# Patient Record
Sex: Female | Born: 1965 | Race: White | Hispanic: No | Smoking: Former smoker
Health system: Southern US, Community
[De-identification: ages and names within clinical notes are randomized; demographics above are authoritative.]

## PROBLEM LIST (undated history)

## (undated) DIAGNOSIS — R51 Headache: Secondary | ICD-10-CM

## (undated) DIAGNOSIS — Z658 Other specified problems related to psychosocial circumstances: Secondary | ICD-10-CM

## (undated) DIAGNOSIS — K219 Gastro-esophageal reflux disease without esophagitis: Secondary | ICD-10-CM

## (undated) DIAGNOSIS — B379 Candidiasis, unspecified: Secondary | ICD-10-CM

## (undated) DIAGNOSIS — Z8619 Personal history of other infectious and parasitic diseases: Secondary | ICD-10-CM

## (undated) DIAGNOSIS — G43829 Menstrual migraine, not intractable, without status migrainosus: Secondary | ICD-10-CM

## (undated) HISTORY — PX: TUBAL LIGATION: SHX77

## (undated) HISTORY — DX: Menstrual migraine, not intractable, without status migrainosus: G43.829

## (undated) HISTORY — DX: Gastro-esophageal reflux disease without esophagitis: K21.9

## (undated) HISTORY — PX: DILATION AND CURETTAGE OF UTERUS: SHX78

## (undated) HISTORY — DX: Personal history of other infectious and parasitic diseases: Z86.19

## (undated) HISTORY — DX: Headache: R51

## (undated) HISTORY — DX: Other specified problems related to psychosocial circumstances: Z65.8

## (undated) HISTORY — DX: Candidiasis, unspecified: B37.9

---

## 2005-01-31 ENCOUNTER — Ambulatory Visit: Payer: Self-pay | Admitting: Family Medicine

## 2006-12-27 ENCOUNTER — Encounter: Admission: RE | Admit: 2006-12-27 | Discharge: 2006-12-27 | Payer: Self-pay | Admitting: Obstetrics and Gynecology

## 2007-02-14 DIAGNOSIS — G43829 Menstrual migraine, not intractable, without status migrainosus: Secondary | ICD-10-CM

## 2007-02-14 HISTORY — DX: Menstrual migraine, not intractable, without status migrainosus: G43.829

## 2007-12-30 ENCOUNTER — Encounter: Admission: RE | Admit: 2007-12-30 | Discharge: 2007-12-30 | Payer: Self-pay | Admitting: Obstetrics and Gynecology

## 2008-12-30 ENCOUNTER — Encounter: Admission: RE | Admit: 2008-12-30 | Discharge: 2008-12-30 | Payer: Self-pay | Admitting: Obstetrics and Gynecology

## 2010-01-06 ENCOUNTER — Encounter: Admission: RE | Admit: 2010-01-06 | Discharge: 2010-01-06 | Payer: Self-pay | Admitting: Obstetrics and Gynecology

## 2010-10-20 ENCOUNTER — Other Ambulatory Visit: Payer: Self-pay | Admitting: Obstetrics and Gynecology

## 2010-10-20 DIAGNOSIS — Z1231 Encounter for screening mammogram for malignant neoplasm of breast: Secondary | ICD-10-CM

## 2010-11-23 DIAGNOSIS — Z658 Other specified problems related to psychosocial circumstances: Secondary | ICD-10-CM

## 2010-11-23 HISTORY — DX: Other specified problems related to psychosocial circumstances: Z65.8

## 2011-01-12 ENCOUNTER — Ambulatory Visit: Payer: Self-pay

## 2011-02-02 ENCOUNTER — Ambulatory Visit
Admission: RE | Admit: 2011-02-02 | Discharge: 2011-02-02 | Disposition: A | Discharge status: Home / Self Care | Payer: 59 | Source: Ambulatory Visit | Attending: Obstetrics and Gynecology | Admitting: Obstetrics and Gynecology

## 2011-02-02 DIAGNOSIS — Z1231 Encounter for screening mammogram for malignant neoplasm of breast: Secondary | ICD-10-CM

## 2011-10-01 ENCOUNTER — Other Ambulatory Visit: Payer: Self-pay | Admitting: Gastroenterology

## 2011-10-01 DIAGNOSIS — K5900 Constipation, unspecified: Secondary | ICD-10-CM

## 2011-10-03 ENCOUNTER — Other Ambulatory Visit: Payer: 59

## 2011-12-07 ENCOUNTER — Other Ambulatory Visit: Payer: 59

## 2012-02-18 ENCOUNTER — Ambulatory Visit: Payer: Self-pay | Admitting: Obstetrics and Gynecology

## 2012-02-29 ENCOUNTER — Other Ambulatory Visit: Payer: Self-pay | Admitting: Obstetrics and Gynecology

## 2012-02-29 DIAGNOSIS — Z1231 Encounter for screening mammogram for malignant neoplasm of breast: Secondary | ICD-10-CM

## 2012-03-10 ENCOUNTER — Ambulatory Visit: Payer: 59

## 2012-03-31 ENCOUNTER — Ambulatory Visit
Admission: RE | Admit: 2012-03-31 | Discharge: 2012-03-31 | Disposition: A | Discharge status: Home / Self Care | Payer: BC Managed Care – PPO | Source: Ambulatory Visit | Attending: Obstetrics and Gynecology | Admitting: Obstetrics and Gynecology

## 2012-03-31 DIAGNOSIS — Z1231 Encounter for screening mammogram for malignant neoplasm of breast: Secondary | ICD-10-CM

## 2012-04-01 ENCOUNTER — Ambulatory Visit (INDEPENDENT_AMBULATORY_CARE_PROVIDER_SITE_OTHER): Payer: BC Managed Care – HMO | Admitting: Obstetrics and Gynecology

## 2012-04-01 ENCOUNTER — Encounter: Payer: Self-pay | Admitting: Obstetrics and Gynecology

## 2012-04-01 VITALS — BP 102/68 | Ht 65.0 in | Wt 134.0 lb

## 2012-04-01 DIAGNOSIS — Z01419 Encounter for gynecological examination (general) (routine) without abnormal findings: Secondary | ICD-10-CM

## 2012-04-01 DIAGNOSIS — Z124 Encounter for screening for malignant neoplasm of cervix: Secondary | ICD-10-CM

## 2012-04-01 DIAGNOSIS — Z139 Encounter for screening, unspecified: Secondary | ICD-10-CM

## 2012-04-01 NOTE — Progress Notes (Signed)
Contraception BTL Last pap 02/22/2011 Last Mammo 03/31/2012 Last Colonoscopy 2013 Last Dexa Scan n/a Primary MD Dr. Lysbeth Galas Abuse at Home no  No complaints Filed Vitals:   04/01/12 1440  BP: 102/68    ROS: noncontributory  Physical Examination: General appearance - alert, well appearing, and in no distress Neck - supple, no significant adenopathy Chest - clear to auscultation, no wheezes, rales or rhonchi, symmetric air entry Heart - normal rate and regular rhythm Abdomen - soft, nontender, nondistended, no masses or organomegaly Breasts - breasts appear normal, no suspicious masses, no skin or nipple changes or axillary nodes Pelvic - normal external genitalia, vulva, vagina, cervix, uterus and adnexa, light menses Back exam - no CVAT Extremities - no edema, redness or tenderness in the calves or thighs  A/P TSH, Vit D, Lipid panel, CBC and CMET Pap Pt had colonoscopy already with h/o polyps

## 2012-04-02 LAB — COMPREHENSIVE METABOLIC PANEL
ALT: 10 U/L (ref 0–35)
AST: 12 U/L (ref 0–37)
Albumin: 4.7 g/dL (ref 3.5–5.2)
CO2: 25 mEq/L (ref 19–32)
Chloride: 106 mEq/L (ref 96–112)
Creat: 0.62 mg/dL (ref 0.50–1.10)
Potassium: 4 mEq/L (ref 3.5–5.3)
Sodium: 140 mEq/L (ref 135–145)
Total Bilirubin: 0.7 mg/dL (ref 0.3–1.2)

## 2012-04-02 LAB — CBC
HCT: 38.1 % (ref 36.0–46.0)
MCV: 89.2 fL (ref 78.0–100.0)
RBC: 4.27 MIL/uL (ref 3.87–5.11)
WBC: 7.3 10*3/uL (ref 4.0–10.5)

## 2012-04-02 LAB — VITAMIN D 25 HYDROXY (VIT D DEFICIENCY, FRACTURES): Vit D, 25-Hydroxy: 20 ng/mL — ABNORMAL LOW (ref 30–89)

## 2012-04-02 LAB — LIPID PANEL
Cholesterol: 141 mg/dL (ref 0–200)
HDL: 57 mg/dL (ref >39)
LDL Cholesterol: 75 mg/dL (ref 0–99)
Triglycerides: 46 mg/dL (ref <150)
VLDL: 9 mg/dL (ref 0–40)

## 2012-04-02 LAB — PAP IG W/ RFLX HPV ASCU

## 2012-04-08 ENCOUNTER — Telehealth: Payer: Self-pay

## 2012-04-08 ENCOUNTER — Other Ambulatory Visit: Payer: Self-pay

## 2012-04-08 DIAGNOSIS — E559 Vitamin D deficiency, unspecified: Secondary | ICD-10-CM

## 2012-04-08 NOTE — Telephone Encounter (Signed)
Called Vit D softgels 50,000 units 1 x weekly x 12 weeks # 20  0 RF's to [facility] per AR. Melody Comas A

## 2012-04-16 ENCOUNTER — Telehealth: Payer: Self-pay

## 2012-04-16 NOTE — Telephone Encounter (Signed)
Left message for pt to return call. Pt need to pick up RX for Vit-d and follow protocol. Mathis Bud

## 2012-04-17 ENCOUNTER — Telehealth: Payer: Self-pay

## 2012-04-17 NOTE — Telephone Encounter (Signed)
Pt returned call regarding Vit-d protocol. Pt stated that she has already picked-up RX and will follow protocol. Mathis Bud

## 2013-05-19 ENCOUNTER — Other Ambulatory Visit: Payer: Self-pay

## 2013-05-19 DIAGNOSIS — Z1231 Encounter for screening mammogram for malignant neoplasm of breast: Secondary | ICD-10-CM

## 2013-06-08 ENCOUNTER — Ambulatory Visit
Admission: RE | Admit: 2013-06-08 | Discharge: 2013-06-08 | Disposition: A | Discharge status: Home / Self Care | Payer: BC Managed Care – PPO | Source: Ambulatory Visit

## 2013-06-08 DIAGNOSIS — Z1231 Encounter for screening mammogram for malignant neoplasm of breast: Secondary | ICD-10-CM

## 2014-06-07 ENCOUNTER — Encounter: Payer: Self-pay | Admitting: Obstetrics and Gynecology

## 2014-07-19 ENCOUNTER — Other Ambulatory Visit: Payer: Self-pay

## 2014-07-19 DIAGNOSIS — Z1231 Encounter for screening mammogram for malignant neoplasm of breast: Secondary | ICD-10-CM

## 2014-08-12 ENCOUNTER — Ambulatory Visit
Admission: RE | Admit: 2014-08-12 | Discharge: 2014-08-12 | Disposition: A | Discharge status: Home / Self Care | Payer: BC Managed Care – PPO | Source: Ambulatory Visit

## 2014-08-12 DIAGNOSIS — Z1231 Encounter for screening mammogram for malignant neoplasm of breast: Secondary | ICD-10-CM

## 2015-07-26 ENCOUNTER — Other Ambulatory Visit: Payer: Self-pay

## 2015-07-26 DIAGNOSIS — Z1231 Encounter for screening mammogram for malignant neoplasm of breast: Secondary | ICD-10-CM

## 2015-08-15 ENCOUNTER — Ambulatory Visit: Payer: Self-pay

## 2015-10-28 ENCOUNTER — Ambulatory Visit (INDEPENDENT_AMBULATORY_CARE_PROVIDER_SITE_OTHER): Payer: BLUE CROSS/BLUE SHIELD | Admitting: Pediatrics

## 2015-10-28 ENCOUNTER — Encounter: Payer: Self-pay | Admitting: Pediatrics

## 2015-10-28 ENCOUNTER — Encounter (INDEPENDENT_AMBULATORY_CARE_PROVIDER_SITE_OTHER): Payer: Self-pay

## 2015-10-28 VITALS — BP 109/69 | HR 67 | Temp 97.7°F | Ht 65.0 in | Wt 148.2 lb

## 2015-10-28 DIAGNOSIS — Z0000 Encounter for general adult medical examination without abnormal findings: Secondary | ICD-10-CM

## 2015-10-28 DIAGNOSIS — F439 Reaction to severe stress, unspecified: Secondary | ICD-10-CM

## 2015-10-28 DIAGNOSIS — Z6824 Body mass index (BMI) 24.0-24.9, adult: Secondary | ICD-10-CM

## 2015-10-28 MED ORDER — CLONAZEPAM 0.5 MG PO TABS: 0.2500 mg | ORAL_TABLET | Freq: Every day | ORAL | Status: DC | PRN

## 2015-10-28 NOTE — Progress Notes (Signed)
Subjective:    Patient ID: Stacy Wright, female    DOB: [birth year], 50 y.o.   MRN: [id number]  CC: New Patient (Initial Visit) and annual exam  HPI: Stacy Wright is a 50 y.o. female presenting for New Patient (Initial Visit)  Husband disabled Pt now primary caregiver Had to quit her job to care for him Does most of his ADLs  Blood pressure good, checks regularly at home Exercise 3-4 times a week  [facility] Pap smear: normal Mammogram: done Monday  Anxiety: ongoing problem with change to caregiver Klonopin helps, takes 2-3 times a week for the last few months Not currently on something daily    Depression screen [facility] 2/9 10/28/2015  Decreased Interest 2  Down, Depressed, Hopeless 1  PHQ - 2 Score 3  Altered sleeping 1  Tired, decreased energy 1  Change in appetite 1  Feeling bad or failure about yourself  1  Trouble concentrating 0  Moving slowly or fidgety/restless 0  Suicidal thoughts 0  PHQ-9 Score 7      ROS: All systems negative other than what is in HPI   Past Medical History  Diagnosis Date  . H/O varicella   . History of measles   . Headache(784.0)     Frequently  . Yeast infection   . Migraine, menstrual 02/14/07  . GERD (gastroesophageal reflux disease)   . Psychosocial stressors 11/23/10   Fam hx: Fam hx: no breast ca, colon Mother--HTN Father--HTN, brain aneurysm Mat Grandfather with leg cancer Pat GF: DM2  Social History   Social History  . Marital Status: Married    Spouse Name: N/A  . Number of Children: N/A  . Years of Education: N/A   Occupational History  . Not on file.   Social History Main Topics  . Smoking status: Former Smoker    Quit date: 10/27/2013  . Smokeless tobacco: Never Used  . Alcohol Use: No  . Drug Use: No  . Sexual Activity: Yes    Birth Control/ Protection: Surgical     Comment: BTL   Other Topics Concern  . Not on file   Social History Narrative     Current Outpatient  Prescriptions  Medication Sig Dispense Refill  . cetirizine (ZYRTEC) 10 MG tablet Take 10 mg by mouth daily.    . D3-50 50000 units capsule   0  . clonazePAM (KLONOPIN) 0.5 MG tablet Take 0.5-1 tablets (0.25-0.5 mg total) by mouth daily as needed for anxiety. 25 tablet 1   No current facility-administered medications for this visit.       Objective:    BP 109/69 mmHg  Pulse 67  Temp(Src) 97.7 F (36.5 C) (Oral)  Ht _0  (1.651 m)  Wt 148 lb 3.2 oz (67.223 kg)  BMI 24.66 kg/m2  Wt Readings from Last 3 Encounters:  10/28/15 148 lb 3.2 oz (67.223 kg)  04/01/12 134 lb (60.782 kg)    Gen: NAD, alert, cooperative with exam, NCAT EYES: EOMI, no scleral injection or icterus ENT:  TMs pearly gray b/l, OP without erythema LYMPH: no cervical LAD CV: NRRR, normal S1/S2, no murmur, distal pulses 2+ b/l Resp: CTABL, no wheezes, normal WOB Abd: +BS, soft, NTND. no guarding or organomegaly Ext: No edema, warm Neuro: Alert and oriented, strength equal b/l UE and LE, coordination grossly normal MSK: normal muscle bulk     Assessment & Plan:    Stacy Wright was seen today for new patient (initial visit).  Diagnoses and all  orders for this visit:  Stress at home If needing more than 2-3 times a week pt to let me know, will need to start on daily medicine to decrease usage. Needs to be seen in 6 months if still on this medicine. -     clonazePAM (KLONOPIN) 0.5 MG tablet; Take 0.5-1 tablets (0.25-0.5 mg total) by mouth daily as needed for anxiety.  Body mass index (BMI) of 24.0-24.9 in adult Continue to work on healthy lifestyle, diet regular exercise  Encounter for preventive health examination -     BMP8+EGFR -     TSH -     CBC  Pap and Mammogram: done earlier this week [facility]   Follow up plan: Return in about 1 year (around 10/27/2016).  Assunta Found, MD [facility] 10/28/2015, 11:29 AM

## 2015-10-29 LAB — BMP8+EGFR
BUN / CREAT RATIO: 18 (ref 9–23)
BUN: 10 mg/dL (ref 6–24)
CALCIUM: 9.5 mg/dL (ref 8.7–10.2)
CO2: 25 mmol/L (ref 18–29)
CREATININE: 0.55 mg/dL — AB (ref 0.57–1.00)
Chloride: 103 mmol/L (ref 96–106)
GFR calc non Af Amer: 111 mL/min/{1.73_m2} (ref >59)
GFR, EST AFRICAN AMERICAN: 128 mL/min/{1.73_m2} (ref >59)
Glucose: 94 mg/dL (ref 65–99)
Potassium: 4 mmol/L (ref 3.5–5.2)
Sodium: 142 mmol/L (ref 134–144)

## 2015-10-29 LAB — CBC
HEMATOCRIT: 37.7 % (ref 34.0–46.6)
HEMOGLOBIN: 12.6 g/dL (ref 11.1–15.9)
MCH: 29.2 pg (ref 26.6–33.0)
MCHC: 33.4 g/dL (ref 31.5–35.7)
MCV: 88 fL (ref 79–97)
Platelets: 160 10*3/uL (ref 150–379)
RBC: 4.31 x10E6/uL (ref 3.77–5.28)
RDW: 13.6 % (ref 12.3–15.4)
WBC: 5.3 10*3/uL (ref 3.4–10.8)

## 2015-10-29 LAB — TSH: TSH: 1.39 u[IU]/mL (ref 0.450–4.500)

## 2015-11-02 ENCOUNTER — Encounter: Payer: Self-pay | Admitting: Pediatrics

## 2015-12-25 ENCOUNTER — Other Ambulatory Visit: Payer: Self-pay | Admitting: Pediatrics

## 2015-12-26 NOTE — Telephone Encounter (Signed)
Please review and advise.

## 2015-12-26 NOTE — Telephone Encounter (Signed)
Authorize 30 days only. Then contact the patient letting them know that they will need an appointment before any further prescriptions can be sent in. 

## 2015-12-26 NOTE — Telephone Encounter (Signed)
[facility] pt. Last filled 12/02/15 for #25, last seen 10/28/15. Call in at [facility]

## 2015-12-27 NOTE — Telephone Encounter (Signed)
RX called into [facility] for Klonopin Okayed per Dr Livia Snellen Left message for pt to schedule appt

## 2016-01-02 ENCOUNTER — Encounter: Payer: Self-pay | Admitting: Pediatrics

## 2016-01-23 ENCOUNTER — Other Ambulatory Visit: Payer: Self-pay | Admitting: Family Medicine

## 2016-01-24 NOTE — Telephone Encounter (Signed)
Last seen 10/28/15 Dr Evette Doffing  If approved route to nurse to call into [facility]

## 2016-01-24 NOTE — Telephone Encounter (Signed)
Refill call to [facility]

## 2016-02-20 ENCOUNTER — Other Ambulatory Visit: Payer: Self-pay | Admitting: Family Medicine

## 2016-02-21 NOTE — Telephone Encounter (Signed)
Last seen 10/28/15  Dr Evette Doffing   If approved route to nurse to call into [facility]

## 2016-04-10 ENCOUNTER — Other Ambulatory Visit: Payer: Self-pay | Admitting: Pediatrics

## 2016-04-11 ENCOUNTER — Other Ambulatory Visit: Payer: Self-pay | Admitting: Pediatrics

## 2016-04-11 NOTE — Telephone Encounter (Signed)
RX for Klonopin called into [facility] Okayed per Dr Livia Snellen Pt notified

## 2016-05-07 ENCOUNTER — Other Ambulatory Visit: Payer: Self-pay | Admitting: Family

## 2016-05-08 NOTE — Telephone Encounter (Signed)
Last seen 10/28/15 - Stacy Wright

## 2016-05-08 NOTE — Telephone Encounter (Signed)
Pt notified she will need to be seen for refill of Klonopin Will call back to schedule

## 2016-05-08 NOTE — Telephone Encounter (Signed)
Because the medication is a controlled substance, I need to see her every six months for refills so pt must be seen for refills as last visit 3/24

## 2016-05-09 ENCOUNTER — Encounter: Payer: Self-pay | Admitting: Pediatrics

## 2016-05-09 ENCOUNTER — Ambulatory Visit (INDEPENDENT_AMBULATORY_CARE_PROVIDER_SITE_OTHER): Payer: BLUE CROSS/BLUE SHIELD | Admitting: Pediatrics

## 2016-05-09 VITALS — BP 111/71 | HR 67 | Temp 98.3°F | Ht 65.0 in | Wt 151.2 lb

## 2016-05-09 DIAGNOSIS — F411 Generalized anxiety disorder: Secondary | ICD-10-CM | POA: Diagnosis not present

## 2016-05-09 DIAGNOSIS — F410 Panic disorder [episodic paroxysmal anxiety] without agoraphobia: Secondary | ICD-10-CM | POA: Diagnosis not present

## 2016-05-09 MED ORDER — CITALOPRAM HYDROBROMIDE 20 MG PO TABS
20.0000 mg | ORAL_TABLET | Freq: Every day | ORAL | 3 refills | Status: DC
Start: 2016-05-09 — End: 2017-01-30

## 2016-05-09 MED ORDER — CLONAZEPAM 0.5 MG PO TABS: ORAL_TABLET | ORAL | 1 refills | Status: DC

## 2016-05-09 NOTE — Patient Instructions (Signed)
Take half tab celexa (citalopram) for 8 days then full tab If you do not notice any improvement in 4 weeks let me know, we will change celexa dose

## 2016-05-09 NOTE — Progress Notes (Signed)
  Subjective:   Patient ID: Stacy Wright, female    DOB: 01-02-1966, 49 y.o.   MRN: [id number] CC: Medication Refill  HPI: Stacy Wright is a 50 y.o. female presenting for Medication Refill  Husband still in bad health Now taking klonopin daily because of extra stress Mostly around husbands health He is disabled, difficult to transfer at home and transport to appointments Lots of stress around taking care of him Panic attacks a couple times a month Working in garden this spring and fall Stays active  Feels safe at home Mood has been up and down   Depression screen [facility] 2/9 05/09/2016 10/28/2015  Decreased Interest 1 2  Down, Depressed, Hopeless 0 1  PHQ - 2 Score 1 3  Altered sleeping - 1  Tired, decreased energy - 1  Change in appetite - 1  Feeling bad or failure about yourself  - 1  Trouble concentrating - 0  Moving slowly or fidgety/restless - 0  Suicidal thoughts - 0  PHQ-9 Score - 7    Relevant past medical, surgical, family and social history reviewed. Allergies and medications reviewed and updated. History  Smoking Status  . Former Smoker  . Quit date: 10/27/2013  Smokeless Tobacco  . Never Used   ROS: Per HPI   Objective:    BP 111/71   Pulse 67   Temp 98.3 F (36.8 C) (Oral)   Ht 5\' 5"  (1.651 m)   Wt 151 lb 3.2 oz (68.6 kg)   BMI 25.16 kg/m   Wt Readings from Last 3 Encounters:  05/09/16 151 lb 3.2 oz (68.6 kg)  10/28/15 148 lb 3.2 oz (67.2 kg)  04/01/12 134 lb (60.8 kg)    Gen: NAD, alert, cooperative with exam, NCAT EYES: EOMI, no conjunctival injection, or no icterus ENT:  TMs pearly gray b/l, OP without erythema LYMPH: no cervical LAD CV: NRRR, normal S1/S2, no murmur, distal pulses 2+ b/l Resp: CTABL, no wheezes, normal WOB Ext: No edema, warm Neuro: Alert and oriented  Assessment & Plan:  Stacy Wright was seen today for medication refill.  Diagnoses and all orders for this visit:  Generalized anxiety disorder Start citalopram, worsening  symptoms, needing clonazepam daily Minimize benzo use as able Gave one Rx with refill, RTC 8 weeks -     clonazePAM (KLONOPIN) 0.5 MG tablet; TAKE ONE-HALF TO ONE TABLET BY MOUTH ONCE DAILY AS NEEDED FOR ANXIETY -     citalopram (CELEXA) 20 MG tablet; Take 1 tablet (20 mg total) by mouth daily.  Panic attacks -     clonazePAM (KLONOPIN) 0.5 MG tablet; TAKE ONE-HALF TO ONE TABLET BY MOUTH ONCE DAILY AS NEEDED FOR ANXIETY -     citalopram (CELEXA) 20 MG tablet; Take 1 tablet (20 mg total) by mouth daily.  Follow up plan: Return in about 8 weeks (around 07/04/2016). Assunta Found, MD [place]

## 2016-05-11 DIAGNOSIS — F411 Generalized anxiety disorder: Secondary | ICD-10-CM | POA: Insufficient documentation

## 2016-06-07 ENCOUNTER — Telehealth: Payer: Self-pay | Admitting: Pediatrics

## 2016-06-07 ENCOUNTER — Encounter: Payer: Self-pay | Admitting: Pediatrics

## 2016-06-07 NOTE — Telephone Encounter (Signed)
Patient informed she will need to be seen to be evaluated.  Patient states she cannot come in today, will call back tomorrow if she is not feeling any better to make appointment.

## 2016-06-07 NOTE — Telephone Encounter (Signed)
lmtcb jkp 11/2 

## 2016-06-08 ENCOUNTER — Encounter: Payer: Self-pay | Admitting: Pediatrics

## 2016-06-08 ENCOUNTER — Ambulatory Visit (INDEPENDENT_AMBULATORY_CARE_PROVIDER_SITE_OTHER): Payer: BLUE CROSS/BLUE SHIELD | Admitting: Pediatrics

## 2016-06-08 VITALS — BP 109/69 | HR 69 | Temp 97.4°F | Ht 65.0 in | Wt 150.2 lb

## 2016-06-08 DIAGNOSIS — M5442 Lumbago with sciatica, left side: Secondary | ICD-10-CM | POA: Diagnosis not present

## 2016-06-08 DIAGNOSIS — F411 Generalized anxiety disorder: Secondary | ICD-10-CM

## 2016-06-08 DIAGNOSIS — G8929 Other chronic pain: Secondary | ICD-10-CM | POA: Diagnosis not present

## 2016-06-08 MED ORDER — TIZANIDINE HCL 2 MG PO CAPS: 2.0000 mg | ORAL_CAPSULE | Freq: Three times a day (TID) | ORAL | 1 refills | Status: DC

## 2016-06-08 NOTE — Patient Instructions (Addendum)

## 2016-06-08 NOTE — Progress Notes (Signed)
  Subjective:   Patient ID: Stacy Wright, female    DOB: [birth year], 50 y.o.   MRN: [id number] CC: Back Pain and Leg Pain  HPI: Stacy Wright is a 50 y.o. female presenting for Back Pain and Leg Pain  Had an injury in low back about 3 yrs ago, seen in ED Was picking up pallet at the time Since then comes and goes and flares  Uses 600mg  of ibuprofen apprx once a day, not every day for low back pain Lifting overweight disabled husband regularly Has been trying to work with him on weight loss, has been hard He ahs fallen a couple of times past 6 mo, followed at [place] had current flare for about two days Feels liek she needs to move constantly  Anxiety and depression: Mood has been ok Goes down with back pain Taking klonopin not every day Has not yet started citalopram Says she doesn't want to feel dependent on medication plannign to start seeing counselor in January, insurance starting to pay for mental health visits  Relevant past medical, surgical, family and social history reviewed. Allergies and medications reviewed and updated. History  Smoking Status  . Former Smoker  . Quit date: 10/27/2013  Smokeless Tobacco  . Never Used   ROS: Per HPI   Objective:    BP 109/69   Pulse 69   Temp 97.4 F (36.3 C) (Oral)   Ht 5\' 5"  (1.651 m)   Wt 150 lb 3.2 oz (68.1 kg)   BMI 24.99 kg/m   Wt Readings from Last 3 Encounters:  06/08/16 150 lb 3.2 oz (68.1 kg)  05/09/16 151 lb 3.2 oz (68.6 kg)  10/28/15 148 lb 3.2 oz (67.2 kg)    Gen: NAD, alert, cooperative with exam, NCAT EYES: EOMI, no conjunctival injection, or no icterus CV: WWP Resp: normal WOB Abd: +BS, soft, NTND. no guarding or organomegaly Ext: No edema, warm Neuro: Alert and oriented, strength equal b/l UE and LE, coordination grossly normal MSK: +SLR L side, neg R, no spine tenderness, TTP paraspinal muscles L side  Assessment & Plan:  Stacy Wright was seen today for back pain and leg pain.  Diagnoses and all  orders for this visit:  Chronic left-sided low back pain with left-sided sciatica Gave gentle back exercises, muscle relaxant PT in future if still ongoing Avoid lifting husband, talk with his PCP about extra help at home -     tizanidine (ZANAFLEX) 2 MG capsule; Take 1 capsule (2 mg total) by mouth 3 (three) times daily.  Generalized anxiety disorder Encouraged cousneling Pt agreeable to trying Using klonopin not daily Doesn't want to start celexa yet  Follow up plan: Return in about 6 months (around 12/06/2016). Assunta Found, MD [place]

## 2016-07-03 ENCOUNTER — Other Ambulatory Visit: Payer: Self-pay | Admitting: Pediatrics

## 2016-08-02 ENCOUNTER — Ambulatory Visit: Payer: BLUE CROSS/BLUE SHIELD

## 2016-08-03 ENCOUNTER — Encounter: Payer: Self-pay | Admitting: Family

## 2016-08-03 ENCOUNTER — Ambulatory Visit (INDEPENDENT_AMBULATORY_CARE_PROVIDER_SITE_OTHER): Payer: BLUE CROSS/BLUE SHIELD | Admitting: Family

## 2016-08-03 VITALS — BP 113/70 | HR 65 | Temp 97.4°F | Ht 65.0 in | Wt 151.0 lb

## 2016-08-03 DIAGNOSIS — J0100 Acute maxillary sinusitis, unspecified: Secondary | ICD-10-CM | POA: Diagnosis not present

## 2016-08-03 MED ORDER — AMOXICILLIN-POT CLAVULANATE 875-125 MG PO TABS: 1.0000 | ORAL_TABLET | Freq: Two times a day (BID) | ORAL | 0 refills | Status: DC

## 2016-08-03 NOTE — Patient Instructions (Signed)

## 2016-08-03 NOTE — Progress Notes (Signed)
   Subjective:    Patient ID: Stacy Wright, female    DOB: 1965-10-20, 49 y.o.   MRN: [id number]  Cough  This is a new problem. The current episode started in the past 7 days. The problem has been gradually worsening. The problem occurs every few minutes. The cough is non-productive. Associated symptoms include ear pain, headaches and a sore throat.  Sinusitis  This is a new problem. The current episode started in the past 7 days. The problem has been gradually worsening since onset. The pain is mild. Associated symptoms include congestion, coughing, diaphoresis, ear pain, headaches, a hoarse voice, sinus pressure, sneezing and a sore throat. Past treatments include oral decongestants and lying down. The treatment provided mild relief.      Review of Systems  Constitutional: Positive for diaphoresis.  HENT: Positive for congestion, ear pain, hoarse voice, sinus pressure, sneezing and sore throat.   Respiratory: Positive for cough.   Neurological: Positive for headaches.  All other systems reviewed and are negative.      Objective:   Physical Exam  Constitutional: She is oriented to person, place, and time. She appears well-developed and well-nourished. No distress.  HENT:  Head: Normocephalic and atraumatic.  Right Ear: External ear normal.  Left Ear: External ear normal.  Nose: Mucosal edema and rhinorrhea present. Right sinus exhibits maxillary sinus tenderness. Left sinus exhibits maxillary sinus tenderness.  Mouth/Throat: Posterior oropharyngeal erythema present.  Eyes: Pupils are equal, round, and reactive to light.  Neck: Normal range of motion. Neck supple. No thyromegaly present.  Cardiovascular: Normal rate, regular rhythm, normal heart sounds and intact distal pulses.   No murmur heard. Pulmonary/Chest: Effort normal and breath sounds normal. No respiratory distress. She has no wheezes.  Abdominal: Soft. Bowel sounds are normal. She exhibits no distension. There is no  tenderness.  Musculoskeletal: Normal range of motion. She exhibits no edema or tenderness.  Neurological: She is alert and oriented to person, place, and time.  Skin: Skin is warm and dry.  Psychiatric: She has a normal mood and affect. Her behavior is normal. Judgment and thought content normal.  Vitals reviewed.    BP 113/70   Pulse 65   Temp 97.4 F (36.3 C) (Oral)   Ht 5\' 5"  (1.651 m)   Wt 151 lb (68.5 kg)   BMI 25.13 kg/m       Assessment & Plan:  1. Acute maxillary sinusitis, recurrence not specified - Take meds as prescribed - Use a cool mist humidifier  -Use saline nose sprays frequently -Saline irrigations of the nose can be very helpful if done frequently.  * 4X daily for 1 week*  * Use of a nettie pot can be helpful with this. Follow directions with this* -Force fluids -For any cough or congestion  Use plain Mucinex- regular strength or max strength is fine   * Children- consult with Pharmacist for dosing -For fever or aces or pains- take tylenol or ibuprofen appropriate for age and weight.  * for fevers greater than 101 orally you may alternate ibuprofen and tylenol every  3 hours. -Throat lozenges if help -New toothbrush in 3 days - amoxicillin-clavulanate (AUGMENTIN) 875-125 MG tablet; Take 1 tablet by mouth 2 (two) times daily.  Dispense: 14 tablet; Refill: 0   Evelina Dun, FNP

## 2016-08-13 ENCOUNTER — Telehealth: Payer: Self-pay | Admitting: Family

## 2016-08-13 DIAGNOSIS — J0100 Acute maxillary sinusitis, unspecified: Secondary | ICD-10-CM

## 2016-08-13 MED ORDER — AMOXICILLIN-POT CLAVULANATE 875-125 MG PO TABS: 1.0000 | ORAL_TABLET | Freq: Two times a day (BID) | ORAL | 0 refills | Status: DC

## 2016-08-13 NOTE — Telephone Encounter (Signed)
Pt aware.

## 2016-08-13 NOTE — Telephone Encounter (Signed)
Sent in another 7 days. Needs to be seen if not improving.

## 2016-08-15 ENCOUNTER — Ambulatory Visit: Payer: BLUE CROSS/BLUE SHIELD | Admitting: Pediatrics

## 2016-09-13 ENCOUNTER — Other Ambulatory Visit: Payer: Self-pay | Admitting: Pediatrics

## 2016-09-13 ENCOUNTER — Other Ambulatory Visit: Payer: Self-pay | Admitting: Family

## 2016-09-13 DIAGNOSIS — F410 Panic disorder [episodic paroxysmal anxiety] without agoraphobia: Secondary | ICD-10-CM

## 2016-09-13 DIAGNOSIS — F411 Generalized anxiety disorder: Secondary | ICD-10-CM

## 2016-11-19 ENCOUNTER — Other Ambulatory Visit: Payer: Self-pay | Admitting: Pediatrics

## 2016-11-19 DIAGNOSIS — F411 Generalized anxiety disorder: Secondary | ICD-10-CM

## 2016-11-19 DIAGNOSIS — F410 Panic disorder [episodic paroxysmal anxiety] without agoraphobia: Secondary | ICD-10-CM

## 2016-11-19 NOTE — Telephone Encounter (Signed)
Last seen by vincent in 06/2016- MMM coverage - please address and route to nurse for phone in.

## 2016-11-19 NOTE — Telephone Encounter (Signed)
Please call in clonazepam with 0 refills 

## 2017-01-25 ENCOUNTER — Other Ambulatory Visit: Payer: Self-pay | Admitting: Nurse Practitioner

## 2017-01-25 DIAGNOSIS — F410 Panic disorder [episodic paroxysmal anxiety] without agoraphobia: Secondary | ICD-10-CM

## 2017-01-25 DIAGNOSIS — F411 Generalized anxiety disorder: Secondary | ICD-10-CM

## 2017-01-28 ENCOUNTER — Other Ambulatory Visit: Payer: Self-pay | Admitting: Nurse Practitioner

## 2017-01-28 DIAGNOSIS — F410 Panic disorder [episodic paroxysmal anxiety] without agoraphobia: Secondary | ICD-10-CM

## 2017-01-28 DIAGNOSIS — F411 Generalized anxiety disorder: Secondary | ICD-10-CM

## 2017-01-28 NOTE — Telephone Encounter (Signed)
Needs an appt, hasnt been seen for 6 months

## 2017-01-28 NOTE — Telephone Encounter (Signed)
Lasr seen 08/03/16  Christy  Dr Evette Doffing PCP  If approved route to nurse to call into [facility]

## 2017-01-28 NOTE — Telephone Encounter (Signed)
Must be seen

## 2017-01-28 NOTE — Telephone Encounter (Signed)
Last seen 08/03/16  Christy  Dr Evette Doffing PCP  If approved route to nurse to call into [facility]

## 2017-01-30 ENCOUNTER — Ambulatory Visit (INDEPENDENT_AMBULATORY_CARE_PROVIDER_SITE_OTHER): Payer: BLUE CROSS/BLUE SHIELD | Admitting: Pediatrics

## 2017-01-30 ENCOUNTER — Encounter: Payer: Self-pay | Admitting: Pediatrics

## 2017-01-30 VITALS — BP 112/67 | HR 67 | Temp 97.9°F | Ht 65.0 in | Wt 146.6 lb

## 2017-01-30 DIAGNOSIS — M7711 Lateral epicondylitis, right elbow: Secondary | ICD-10-CM

## 2017-01-30 DIAGNOSIS — F411 Generalized anxiety disorder: Secondary | ICD-10-CM | POA: Diagnosis not present

## 2017-01-30 DIAGNOSIS — F410 Panic disorder [episodic paroxysmal anxiety] without agoraphobia: Secondary | ICD-10-CM

## 2017-01-30 MED ORDER — CITALOPRAM HYDROBROMIDE 20 MG PO TABS: 20.0000 mg | ORAL_TABLET | Freq: Every day | ORAL | 1 refills | Status: DC

## 2017-01-30 MED ORDER — CLONAZEPAM 0.5 MG PO TABS: ORAL_TABLET | ORAL | 2 refills | Status: DC

## 2017-01-30 NOTE — Patient Instructions (Addendum)
Counterforce brace for lateral epicondilitis (tennis elbow)   Tennis Elbow Rehab Ask your health care provider which exercises are safe for you. Do exercises exactly as told by your health care provider and adjust them as directed. It is normal to feel mild stretching, pulling, tightness, or discomfort as you do these exercises, but you should stop right away if you feel sudden pain or your pain gets worse. Do not begin these exercises until told by your health care provider. Stretching and range of motion exercises These exercises warm up your muscles and joints and improve the movement and flexibility of your elbow. These exercises also help to relieve pain, numbness, and tingling. Exercise A: Wrist extensor stretch 1. Extend your left / right elbow with your fingers pointing down. 2. Gently pull the palm of your left / right hand toward you until you feel a gentle stretch on the top of your forearm. 3. To increase the stretch, push your left / right hand toward the outer edge or pinkie side of your forearm. 4. Hold this position for __________ seconds. Repeat __________ times. Complete this exercise __________ times a day. If directed by your health care provider, repeat this stretch except do it with a bent elbow this time. Exercise B: Wrist flexor stretch  1. Extend your left / right elbow and turn your palm upward. 2. Gently pull your left / right palm and fingertips back so your wrist extends and your fingers point more toward the ground. 3. You should feel a gentle stretch on the inside of your forearm. 4. Hold this position for __________ seconds. Repeat __________ times. Complete this exercise __________ times a day. If directed by your health care provider, repeat this stretch except do it with a bent elbow this time. Strengthening exercises These exercises build strength and endurance in your elbow. Endurance is the ability to use your muscles for a long time, even after they get  tired. Exercise C: Wrist extensors  1. Sit with your left / right forearm palm-down and fully supported on a table or countertop. Your elbow should be resting below the height of your shoulder. 2. Let your left / right wrist extend over the edge of the surface. 3. Loosely hold a __________ weight or a piece of rubber exercise band or tubing in your left / right hand. Slowly curl your left / right hand up toward your forearm. If you are using band or tubing, hold the band or tubing in place with your other hand to provide resistance. 4. Hold this position for __________ seconds. 5. Slowly return to the starting position. Repeat __________ times. Complete this exercise __________ times a day. Exercise D: Radial deviators  1. Stand with a __________ weight in your left / righthand. Or, sit while holding a rubber exercise band or tubing with your other arm supported on a table or countertop. Position your hand so your thumb is on top. 2. Raise your hand upward in front of you so your thumb travels toward your forearm, or pull up on the rubber tubing. 3. Hold this position for __________ seconds. 4. Slowly return to the starting position. Repeat __________ times. Complete this exercise __________ times a day. Exercise E: Eccentric wrist extensors 1. Sit with your left / right forearm palm-down and fully supported on a table or countertop. Your elbow should be resting below the height of your shoulder. 2. If told by your health care provider, hold a __________ weight in your hand. 3. Let your  left / right wrist extend over the edge of the surface. 4. Use your other hand to lift up your left / right hand toward your forearm. Keep your forearm on the table. 5. Using only the muscles in your left / right hand, slowly lower your hand back down to the starting position. Repeat __________ times. Complete this exercise __________ times a day. This information is not intended to replace advice given to you  by your health care provider. Make sure you discuss any questions you have with your health care provider. Document Released: 07/23/2005 Document Revised: 03/28/2016 Document Reviewed: 04/21/2015 Elsevier Interactive Patient Education  [employer].

## 2017-01-30 NOTE — Progress Notes (Signed)
  Subjective:   Patient ID: Stacy Wright, female    DOB: April 14, 1966, 51 y.o.   MRN: [id number] CC: Medication Refill and Elbow Pain (Right)  HPI: Terese Heier is a 51 y.o. female presenting for Medication Refill and Elbow Pain (Right)  R Elbow pain: moving her husband a lot, he is disabled Trying to get him to do more of the weight  Anxiety, depression Mood: much better Taking citalopram every day now Using klonopin a few days a week No recent panic attakcs  Back pain: still caring for chronically il husband, lifting him a lot Has been able to get him to do more for himself recently  Relevant past medical, surgical, family and social history reviewed. Allergies and medications reviewed and updated. History  Smoking Status  . Former Smoker  . Quit date: 10/27/2013  Smokeless Tobacco  . Never Used   ROS: Per HPI   Objective:    BP 112/67   Pulse 67   Temp 97.9 F (36.6 C) (Oral)   Ht 5\' 5"  (1.651 m)   Wt 146 lb 9.6 oz (66.5 kg)   BMI 24.40 kg/m   Wt Readings from Last 3 Encounters:  01/30/17 146 lb 9.6 oz (66.5 kg)  08/03/16 151 lb (68.5 kg)  06/08/16 150 lb 3.2 oz (68.1 kg)    Gen: NAD, alert, cooperative with exam, NCAT EYES: EOMI, no conjunctival injection, or no icterus CV: NRRR, normal S1/S2, no murmur, distal pulses 2+ b/l Resp: CTABL, no wheezes, normal WOB Abd: +BS, soft, NTND. no guarding or organomegaly Ext: No edema, warm Neuro: Alert and oriented MSK: normal R forearm, elbow to sinpection No redness or swelling ttp Lateral R epicondyle Pain with supination R forearm against resistance  Assessment & Plan:  Asna was seen today for medication refill and elbow pain.  Diagnoses and all orders for this visit:  Lateral epicondylitis of right elbow New problem Rest, NSAIDs, ice, counterforce brace Slow return to lifting  Panic attacks Improved, klonopin 2-3 days a week Cont for now -     clonazePAM (KLONOPIN) 0.5 MG tablet; Take 1/2 to 1 Tablet by  mouth once daily as needed -     citalopram (CELEXA) 20 MG tablet; Take 1 tablet (20 mg total) by mouth daily.  Generalized anxiety disorder Improved with citalopram Cont citalopram, klonopin as needed If needing more regularly let m eknow rtc for refills -     clonazePAM (KLONOPIN) 0.5 MG tablet; Take 1/2 to 1 Tablet by mouth once daily as needed -     citalopram (CELEXA) 20 MG tablet; Take 1 tablet (20 mg total) by mouth daily.   Follow up plan: Return in about 6 months (around 08/01/2017). Assunta Found, MD [place]

## 2017-02-28 ENCOUNTER — Ambulatory Visit (INDEPENDENT_AMBULATORY_CARE_PROVIDER_SITE_OTHER): Payer: BLUE CROSS/BLUE SHIELD

## 2017-02-28 ENCOUNTER — Encounter: Payer: Self-pay | Admitting: Family

## 2017-02-28 ENCOUNTER — Ambulatory Visit (INDEPENDENT_AMBULATORY_CARE_PROVIDER_SITE_OTHER): Payer: BLUE CROSS/BLUE SHIELD | Admitting: Family

## 2017-02-28 VITALS — BP 105/70 | HR 62 | Temp 98.2°F | Ht 65.0 in | Wt 146.0 lb

## 2017-02-28 DIAGNOSIS — M7711 Lateral epicondylitis, right elbow: Secondary | ICD-10-CM

## 2017-02-28 DIAGNOSIS — M25521 Pain in right elbow: Secondary | ICD-10-CM

## 2017-02-28 MED ORDER — PREDNISONE 10 MG (21) PO TBPK: ORAL_TABLET | ORAL | 0 refills | Status: DC

## 2017-02-28 MED ORDER — NAPROXEN 500 MG PO TABS: 500.0000 mg | ORAL_TABLET | Freq: Two times a day (BID) | ORAL | 1 refills | Status: DC

## 2017-02-28 NOTE — Patient Instructions (Signed)
Tennis Elbow Tennis elbow (lateral epicondylitis) is inflammation of the outer tendons of your forearm close to your elbow. Your tendons attach your muscles to your bones. The outer tendons of your forearm are used to extend your wrist, and they attach on the outside part of your elbow. Tennis elbow is often found in people who play tennis, but anyone may get the condition from repeatedly extending the wrist or turning the forearm. What are the causes? This condition is caused by repeatedly extending your wrist and using your hands. It can result from sports or work that requires repetitive forearm movements. Tennis elbow may also be caused by an injury. What increases the risk? You have a higher risk of developing tennis elbow if you play tennis or another racquet sport. You also have a higher risk if you frequently use your hands for work. This condition is also more likely to develop in:  Musicians.  Carpenters, painters, and plumbers.  Cooks.  Cashiers.  People who work in factories.  Construction workers.  Butchers.  People who use computers.  What are the signs or symptoms? Symptoms of this condition include:  Pain and tenderness in your forearm and the outer part of your elbow. You may only feel the pain when you use your arm, or you may feel it even when you are not using your arm.  A burning feeling that runs from your elbow through your arm.  Weak grip in your hands.  How is this diagnosed? This condition may be diagnosed by medical history and physical exam. You may also have other tests, including:  X-rays.  MRI.  How is this treated? Your health care provider will recommend lifestyle adjustments, such as resting and icing your arm. Treatment may also include:  Medicines for inflammation. This may include shots of cortisone if your pain continues.  Physical therapy. This may include massage or exercises.  An elbow brace.  Surgery may eventually be  recommended if your pain does not go away with treatment. Follow these instructions at home: Activity  Rest your elbow and wrist as directed by your health care provider. Try to avoid any activities that caused the problem until your health care provider says that you can do them again.  If a physical therapist teaches you exercises, do all of them as directed.  If you lift an object, lift it with your palm facing upward. This lowers the stress on your elbow. Lifestyle  If your tennis elbow is caused by sports, check your equipment and make sure that: ? You are using it correctly. ? It is the best fit for you.  If your tennis elbow is caused by work, take breaks frequently, if you are able. Talk with your manager about how to best perform tasks in a way that is safe. ? If your tennis elbow is caused by computer use, talk with your manager about any changes that can be made to your work environment. General instructions  If directed, apply ice to the painful area: ? Put ice in a plastic bag. ? Place a towel between your skin and the bag. ? Leave the ice on for 20 minutes, 2-3 times per day.  Take medicines only as directed by your health care provider.  If you were given a brace, wear it as directed by your health care provider.  Keep all follow-up visits as directed by your health care provider. This is important. Contact a health care provider if:  Your pain does not   get better with treatment.  Your pain gets worse.  You have numbness or weakness in your forearm, hand, or fingers. This information is not intended to replace advice given to you by your health care provider. Make sure you discuss any questions you have with your health care provider. Document Released: 07/23/2005 Document Revised: 03/22/2016 Document Reviewed: 07/19/2014 Elsevier Interactive Patient Education  2018 Else[employer] Inc[employer]  

## 2017-02-28 NOTE — Progress Notes (Addendum)
   Subjective:    Patient ID: Stacy Wright, female    DOB: 30-Apr-1966, 51 y.o.   MRN: [id number]  HPI Pt presents to the office today with right elbow pain that started a few months ago that is worse. PT states intermittent aching pain of 5-6 out 10. PT has tried rest, Voltaren gel, motrin, and wearing a brace with mild relief.   PT denies any injury, but states she cares for her husband and uses it "a lot". Pt is requesting x-ray today.    Review of Systems  Musculoskeletal: Positive for arthralgias.  All other systems reviewed and are negative.      Objective:   Physical Exam  Constitutional: She is oriented to person, place, and time. She appears well-developed and well-nourished. No distress.  HENT:  Head: Normocephalic and atraumatic.  Right Ear: External ear normal.  Mouth/Throat: Oropharynx is clear and moist.  Eyes: Pupils are equal, round, and reactive to light.  Neck: Normal range of motion. Neck supple. No thyromegaly present.  Cardiovascular: Normal rate, regular rhythm, normal heart sounds and intact distal pulses.   No murmur heard. Pulmonary/Chest: Effort normal and breath sounds normal. No respiratory distress. She has no wheezes.  Abdominal: Soft. Bowel sounds are normal. She exhibits no distension. There is no tenderness.  Musculoskeletal: Normal range of motion. She exhibits tenderness (mild tenderness in  right lateral elbow ). She exhibits no edema.  Neurological: She is alert and oriented to person, place, and time. She has normal reflexes. No cranial nerve deficit.  Skin: Skin is warm and dry.  Psychiatric: She has a normal mood and affect. Her behavior is normal. Judgment and thought content normal.  Vitals reviewed.  X-ray- Negative Preliminary reading by Evelina Dun, FNP [facility]    BP 105/70   Pulse 62   Temp 98.2 F (36.8 C) (Oral)   Ht 5\' 5"  (1.651 m)   Wt 146 lb (66.2 kg)   BMI 24.30 kg/m      Assessment & Plan:  1. Lateral epicondylitis of  right elbow - predniSONE (STERAPRED UNI-PAK 21 TAB) 10 MG (21) TBPK tablet; Use as directed  Dispense: 21 tablet; Refill: 0 - naproxen (NAPROSYN) 500 MG tablet; Take 1 tablet (500 mg total) by mouth 2 (two) times daily with a meal.  Dispense: 60 tablet; Refill: 1  2. Right elbow pain - DG Elbow 2 Views Right; Future - predniSONE (STERAPRED UNI-PAK 21 TAB) 10 MG (21) TBPK tablet; Use as directed  Dispense: 21 tablet; Refill: 0 - naproxen (NAPROSYN) 500 MG tablet; Take 1 tablet (500 mg total) by mouth 2 (two) times daily with a meal.  Dispense: 60 tablet; Refill: 1  Rest Ice  Continue wear brace RTO prn   Evelina Dun, FNP

## 2017-04-01 ENCOUNTER — Other Ambulatory Visit: Payer: Self-pay | Admitting: Pediatrics

## 2017-04-01 DIAGNOSIS — F411 Generalized anxiety disorder: Secondary | ICD-10-CM

## 2017-04-01 DIAGNOSIS — F410 Panic disorder [episodic paroxysmal anxiety] without agoraphobia: Secondary | ICD-10-CM

## 2017-04-02 NOTE — Telephone Encounter (Signed)
Will need to be seen. #30 a month with 2 refills prescribed 01/30/2017, not taking every day. Should not be out yet.

## 2017-04-10 NOTE — Telephone Encounter (Signed)
Pt called - she is not out - she just picked some up

## 2017-04-10 NOTE — Telephone Encounter (Signed)
Phoned in.

## 2017-04-10 NOTE — Telephone Encounter (Signed)
Note Error - not phoned in - pt to be called

## 2017-04-11 ENCOUNTER — Other Ambulatory Visit: Payer: Self-pay | Admitting: *Deleted

## 2017-04-11 DIAGNOSIS — F410 Panic disorder [episodic paroxysmal anxiety] without agoraphobia: Secondary | ICD-10-CM

## 2017-04-11 DIAGNOSIS — F411 Generalized anxiety disorder: Secondary | ICD-10-CM

## 2017-04-11 MED ORDER — CLONAZEPAM 0.5 MG PO TABS: ORAL_TABLET | ORAL | 2 refills | Status: DC

## 2017-04-11 NOTE — Telephone Encounter (Signed)
Pt requesting refill on Klonopin Last seen 02/28/2017 Please advise

## 2017-04-11 NOTE — Telephone Encounter (Signed)
OK to call in, thanks!

## 2017-04-12 NOTE — Telephone Encounter (Signed)
Called to [facility]

## 2017-06-14 ENCOUNTER — Ambulatory Visit: Payer: BLUE CROSS/BLUE SHIELD | Admitting: Pediatrics

## 2017-06-14 ENCOUNTER — Encounter: Payer: Self-pay | Admitting: Pediatrics

## 2017-06-14 DIAGNOSIS — N951 Menopausal and female climacteric states: Secondary | ICD-10-CM | POA: Insufficient documentation

## 2017-06-14 DIAGNOSIS — F411 Generalized anxiety disorder: Secondary | ICD-10-CM

## 2017-06-14 DIAGNOSIS — N926 Irregular menstruation, unspecified: Secondary | ICD-10-CM | POA: Insufficient documentation

## 2017-06-14 DIAGNOSIS — R454 Irritability and anger: Secondary | ICD-10-CM | POA: Insufficient documentation

## 2017-06-14 DIAGNOSIS — E559 Vitamin D deficiency, unspecified: Secondary | ICD-10-CM | POA: Insufficient documentation

## 2017-06-14 DIAGNOSIS — F410 Panic disorder [episodic paroxysmal anxiety] without agoraphobia: Secondary | ICD-10-CM | POA: Diagnosis not present

## 2017-06-14 MED ORDER — CLONAZEPAM 0.5 MG PO TABS: ORAL_TABLET | ORAL | 2 refills | Status: DC

## 2017-06-14 MED ORDER — CITALOPRAM HYDROBROMIDE 20 MG PO TABS: 20.0000 mg | ORAL_TABLET | Freq: Every day | ORAL | 1 refills | Status: DC

## 2017-06-14 NOTE — Progress Notes (Signed)
  Subjective:   Patient ID: Stacy Wright, female    DOB: 1966-02-01, 51 y.o.   MRN: [id number] CC: Medication Refill  HPI: Stacy Wright is a 51 y.o. female presenting for Medication Refill  Anxiety: husband died 3 weeks ago, anxiety levels have been higher Has good support in family Sister looking into grief counseling that they can go to together She says she is getting through Looking for work Taking clonazepam at most twice a day, sometimes none, but more often since his death Mood has been down but she is hopeful will keep getting better Says celexa has been helping a lot, does not want to increase dose, thinks it is helping enough  Relevant past medical, surgical, family and social history reviewed. Allergies and medications reviewed and updated. Social History   Tobacco Use  Smoking Status Former Smoker  . Last attempt to quit: 10/27/2013  . Years since quitting: 3.6  Smokeless Tobacco Never Used   ROS: Per HPI   Objective:    BP 130/74   Pulse 78   Temp 98.8 F (37.1 C) (Oral)   Ht 5\' 5"  (1.651 m)   Wt 145 lb 12.8 oz (66.1 kg)   BMI 24.26 kg/m   Wt Readings from Last 3 Encounters:  06/14/17 145 lb 12.8 oz (66.1 kg)  02/28/17 146 lb (66.2 kg)  01/30/17 146 lb 9.6 oz (66.5 kg)    Gen: NAD, alert, cooperative with exam, NCAT EYES: EOMI, no conjunctival injection, or no icterus ENT:  TMs pearly gray b/l, OP with mild erythema LYMPH: no cervical LAD CV: NRRR, normal S1/S2, no murmur, distal pulses 2+ b/l Resp: CTABL, no wheezes, normal WOB Abd: +BS, soft, NTND. no guarding or organomegaly Ext: No edema, warm Neuro: Alert and oriented, strength equal b/l UE and LE, coordination grossly normal  Assessment & Plan:  Stacy Wright was seen today for medication refill.  Diagnoses and all orders for this visit: Generalized anxiety disorder Panic attacks celexa helping, some more symptoms since recent death of husband, starting counseling, has good family support Will let  us know if any worsening in symptoms Goal to get back to occasional use of clonazepam, not daily Pt in agreement -     citalopram (CELEXA) 20 MG tablet; Take 1 tablet (20 mg total) daily by mouth. -     clonazePAM (KLONOPIN) 0.5 MG tablet; Take 1/2 to 1 Tablet by mouth once daily as needed  Follow up plan: Return in about 3 months (around 09/14/2017). Assunta Found, MD [place]

## 2017-07-01 ENCOUNTER — Encounter: Payer: Self-pay | Admitting: Family Medicine

## 2017-07-01 ENCOUNTER — Ambulatory Visit: Payer: BLUE CROSS/BLUE SHIELD | Admitting: Family Medicine

## 2017-07-01 VITALS — BP 109/71 | HR 73 | Temp 96.7°F | Ht 65.0 in | Wt 146.6 lb

## 2017-07-01 DIAGNOSIS — B9789 Other viral agents as the cause of diseases classified elsewhere: Secondary | ICD-10-CM | POA: Diagnosis not present

## 2017-07-01 DIAGNOSIS — J988 Other specified respiratory disorders: Secondary | ICD-10-CM | POA: Diagnosis not present

## 2017-07-01 MED ORDER — BENZONATATE 100 MG PO CAPS: 100.0000 mg | ORAL_CAPSULE | Freq: Three times a day (TID) | ORAL | 0 refills | Status: DC | PRN

## 2017-07-01 NOTE — Progress Notes (Signed)
   HPI  Patient presents today here with cough.  Patient explains she has had 3 days of cough, nasal congestion, headache, and sore throat.  Headache is in the frontal area. For her sinuses she generally takes Zyrtec.  She does not prefer having nasal sprays, however she is willing to consider Flonase.  She denies fever, chills, sweats.  She did have some back tingling yesterday which was unusual but overall muscle aches  PMH: Smoking status noted ROS: Per HPI  Objective: BP 109/71   Pulse 73   Temp (!) 96.7 F (35.9 C) (Oral)   Ht 5\' 5"  (1.651 m)   Wt 146 lb 9.6 oz (66.5 kg)   SpO2 97%   BMI 24.40 kg/m  Gen: NAD, alert, cooperative with exam HEENT: NCAT, oropharynx with mild erythema, TMs normal bilaterally, nares with left-sided swollen turbinate CV: RRR, good S1/S2, no murmur Resp: CTABL, no wheezes, non-labored Abd: SNTND, BS present, no guarding or organomegaly Ext: No edema, warm Neuro: Alert and oriented, No gross deficits  Assessment and plan:  #Viral respiratory illness Likely viral etiology, reassurance provided Continue supportive care, given Tessalon as well. Discussed usual course of illness Lung base could be helpful as well, patient is resistant. Return to clinic with concerns  Meds ordered this encounter  Medications  . benzonatate (TESSALON PERLES) 100 MG capsule    Sig: Take 1 capsule (100 mg total) by mouth 3 (three) times daily as needed for cough.    Dispense:  20 capsule    Refill:  [place], MD [facility] 07/01/2017, 10:44 AM

## 2017-07-01 NOTE — Patient Instructions (Signed)
Great to meet you!   Viral Respiratory Infection A respiratory infection is an illness that affects part of the respiratory system, such as the lungs, nose, or throat. Most respiratory infections are caused by either viruses or bacteria. A respiratory infection that is caused by a virus is called a viral respiratory infection. Common types of viral respiratory infections include:  A cold.  The flu (influenza).  A respiratory syncytial virus (RSV) infection.  How do I know if I have a viral respiratory infection? Most viral respiratory infections cause:  A stuffy or runny nose.  Yellow or green nasal discharge.  A cough.  Sneezing.  Fatigue.  Achy muscles.  A sore throat.  Sweating or chills.  A fever.  A headache.  How are viral respiratory infections treated? If influenza is diagnosed early, it may be treated with an antiviral medicine that shortens the length of time a person has symptoms. Symptoms of viral respiratory infections may be treated with over-the-counter and prescription medicines, such as:  Expectorants. These make it easier to cough up mucus.  Decongestant nasal sprays.  Health care providers do not prescribe antibiotic medicines for viral infections. This is because antibiotics are designed to kill bacteria. They have no effect on viruses. How do I know if I should stay home from work or school? To avoid exposing others to your respiratory infection, stay home if you have:  A fever.  A persistent cough.  A sore throat.  A runny nose.  Sneezing.  Muscles aches.  Headaches.  Fatigue.  Weakness.  Chills.  Sweating.  Nausea.  Follow these instructions at home:  Rest as much as possible.  Take over-the-counter and prescription medicines only as told by your health care provider.  Drink enough fluid to keep your urine clear or pale yellow. This helps prevent dehydration and helps loosen up mucus.  Gargle with a salt-water  mixture 3-4 times per day or as needed. To make a salt-water mixture, completely dissolve -1 tsp of salt in 1 cup of warm water.  Use nose drops made from salt water to ease congestion and soften raw skin around your nose.  Do not drink alcohol.  Do not use tobacco products, including cigarettes, chewing tobacco, and e-cigarettes. If you need help quitting, ask your health care provider. Contact a health care provider if:  Your symptoms last for 10 days or longer.  Your symptoms get worse over time.  You have a fever.  You have severe sinus pain in your face or forehead.  The glands in your jaw or neck become very swollen. Get help right away if:  You feel pain or pressure in your chest.  You have shortness of breath.  You faint or feel like you will faint.  You have severe and persistent vomiting.  You feel confused or disoriented. This information is not intended to replace advice given to you by your health care provider. Make sure you discuss any questions you have with your health care provider. Document Released: 05/02/2005 Document Revised: 12/29/2015 Document Reviewed: 12/29/2014 Elsevier Interactive Patient Education  2017 [employer].

## 2017-07-10 ENCOUNTER — Telehealth: Payer: Self-pay | Admitting: Pediatrics

## 2017-07-10 NOTE — Telephone Encounter (Signed)
Needs to be seen for exam. Can try sinus rinses, tylenol, flonase, cetirizine. Is she having SOB, fevers, decreased appetite, facial pain?

## 2017-07-11 NOTE — Telephone Encounter (Signed)
Pt aware.

## 2018-08-25 ENCOUNTER — Encounter: Payer: Self-pay | Admitting: Pediatrics

## 2018-08-25 ENCOUNTER — Ambulatory Visit: Payer: BLUE CROSS/BLUE SHIELD | Admitting: Pediatrics

## 2018-08-25 VITALS — BP 116/75 | HR 64 | Temp 97.0°F | Ht 65.0 in | Wt 133.0 lb

## 2018-08-25 DIAGNOSIS — Z113 Encounter for screening for infections with a predominantly sexual mode of transmission: Secondary | ICD-10-CM

## 2018-08-25 DIAGNOSIS — B001 Herpesviral vesicular dermatitis: Secondary | ICD-10-CM

## 2018-08-25 LAB — URINALYSIS, MICROSCOPIC ONLY
Bacteria, UA: NONE SEEN
RBC, UA: NONE SEEN /hpf (ref 0–2)
Renal Epithel, UA: NONE SEEN /hpf

## 2018-08-25 MED ORDER — VALACYCLOVIR HCL 500 MG PO TABS: ORAL_TABLET | ORAL | 5 refills | Status: DC

## 2018-08-25 NOTE — Progress Notes (Signed)
  Subjective:   Patient ID: Stacy Wright, female    DOB: [birth year], 53 y.o.   MRN: [id number] CC: Mouth Lesions  HPI: Stacy Wright is a 53 y.o. female   Has had cold sores off and on for years, rarely with symptoms until last few months.  Has had 3 outbreaks in the last 3 months.  Has been taking lysine, not helping enough.  New sexual partner, interested in STI screening.  No vaginal discharge, dyspareunia.  Has had her tubes tied.  Has been using barrier protection.  Last menstrual period was late, about 4 months ago.  Has had irregular periods over the last few years related to menopause.  Pap smear: Last done with gynecology 1 year ago.  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  No fevers.  Mood and anxiety been doing okay, not taking any medicines at this time other than Zyrtec for allergies.  Husband passed just over a year ago after prolonged illness.  Relevant past medical, surgical, family and social history reviewed. Allergies and medications reviewed and updated. Social History   Tobacco Use  Smoking Status Former Smoker  . Last attempt to quit: 10/27/2013  . Years since quitting: 4.8  Smokeless Tobacco Never Used   ROS: Per HPI   Objective:    BP 116/75 (BP Location: Left Arm)   Pulse 64   Temp (!) 97 F (36.1 C) (Oral)   Ht 5\' 5"  (1.651 m)   Wt 133 lb (60.3 kg)   BMI 22.13 kg/m   Wt Readings from Last 3 Encounters:  08/25/18 133 lb (60.3 kg)  07/01/17 146 lb 9.6 oz (66.5 kg)  06/14/17 145 lb 12.8 oz (66.1 kg)    Gen: NAD, alert, cooperative with exam, NCAT EYES: EOMI, no conjunctival injection, or no icterus CV: NRRR, normal S1/S2, no murmur, distal pulses 2+ b/l Resp: CTABL, no wheezes, normal WOB Abd: +BS, soft, NTND. no guarding or organomegaly Ext: No edema, warm Neuro: Alert and oriented MSK: normal muscle bulk  Assessment & Plan:  Stacy Wright was seen today for mouth lesions.  Diagnoses and all orders for this  visit:  Herpes labialis For next 3 months take preventive daily Valtrex.  For outbreaks take 2 g twice daily for 2 doses. -     valACYclovir (VALTREX) 500 MG tablet; Take one tab daily for prevention. For outbreaks take 4 tabs (2g) q12h for 2 doses.  Routine screening for STI (sexually transmitted infection) -     HIV Antibody (routine testing w rflx) -     RPR -     GC/Chlamydia Probe Amp -     Urine Microscopic   Follow up plan: Return in about 4 months (around 12/24/2018) for pap/well visit. Assunta Found, MD [place]

## 2018-08-26 LAB — RPR: RPR Ser Ql: REACTIVE — AB

## 2018-08-26 LAB — RPR, QUANT+TP ABS (REFLEX)
Rapid Plasma Reagin, Quant: 1:1 {titer} — ABNORMAL HIGH
T Pallidum Abs: NONREACTIVE

## 2018-08-26 LAB — HIV ANTIBODY (ROUTINE TESTING W REFLEX): HIV Screen 4th Generation wRfx: NONREACTIVE

## 2018-08-27 ENCOUNTER — Other Ambulatory Visit: Payer: Self-pay | Admitting: Pediatrics

## 2018-08-28 ENCOUNTER — Other Ambulatory Visit: Payer: Self-pay | Admitting: *Deleted

## 2018-08-28 ENCOUNTER — Telehealth: Payer: Self-pay | Admitting: Pediatrics

## 2018-08-28 DIAGNOSIS — Z113 Encounter for screening for infections with a predominantly sexual mode of transmission: Secondary | ICD-10-CM

## 2018-08-28 LAB — GC/CHLAMYDIA PROBE AMP
Chlamydia trachomatis, NAA: NEGATIVE
NEISSERIA GONORRHOEAE BY PCR: NEGATIVE

## 2018-08-28 NOTE — Telephone Encounter (Signed)
Patient aware of results and verbalizes understanding.  

## 2018-12-02 ENCOUNTER — Encounter: Payer: No Typology Code available for payment source | Admitting: Physician Assistant

## 2019-02-16 ENCOUNTER — Other Ambulatory Visit: Payer: Self-pay

## 2019-02-17 ENCOUNTER — Ambulatory Visit (INDEPENDENT_AMBULATORY_CARE_PROVIDER_SITE_OTHER): Payer: No Typology Code available for payment source | Admitting: Physician Assistant

## 2019-02-17 ENCOUNTER — Encounter: Payer: Self-pay | Admitting: Physician Assistant

## 2019-02-17 VITALS — BP 113/76 | HR 57 | Temp 97.7°F | Ht 65.0 in | Wt 119.4 lb

## 2019-02-17 DIAGNOSIS — Z23 Encounter for immunization: Secondary | ICD-10-CM | POA: Diagnosis not present

## 2019-02-17 DIAGNOSIS — Z0000 Encounter for general adult medical examination without abnormal findings: Secondary | ICD-10-CM

## 2019-02-17 MED ORDER — VALACYCLOVIR HCL 500 MG PO TABS: ORAL_TABLET | ORAL | 3 refills | Status: DC

## 2019-02-17 NOTE — Progress Notes (Signed)
BP 113/76   Pulse (!) 57   Temp 97.7 F (36.5 C) (Oral)   Ht _0  (1.651 m)   Wt 119 lb 6.4 oz (54.2 kg)   BMI 19.87 kg/m    Subjective:    Patient ID: Stacy Wright, female    DOB: 03/29/1966, 53 y.o.   MRN: [id number]  HPI: Stacy Wright is a 53 y.o. female presenting on 02/17/2019 for Annual Exam  This patient comes in for annual well physical examination. All medications are reviewed today. There are no reports of any problems with the medications. All of the medical conditions are reviewed and updated.  Lab work is reviewed and will be ordered as medically necessary. There are no new problems reported with today's visit.  Patient reports doing well overall.   Past Medical History:  Diagnosis Date  . GERD (gastroesophageal reflux disease)   . H/O varicella   . Headache(784.0)    Frequently  . History of measles   . Migraine, menstrual 02/14/07  . Psychosocial stressors 11/23/10  . Yeast infection    Relevant past medical, surgical, family and social history reviewed and updated as indicated. Interim medical history since our last visit reviewed. Allergies and medications reviewed and updated. DATA REVIEWED: CHART IN EPIC  Family History reviewed for pertinent findings.  Review of Systems  Constitutional: Negative.  Negative for activity change, fatigue and fever.  HENT: Positive for rhinorrhea and sneezing.   Eyes: Negative.   Respiratory: Negative.  Negative for cough.   Cardiovascular: Negative.  Negative for chest pain.  Gastrointestinal: Negative.  Negative for abdominal pain.  Endocrine: Negative.   Genitourinary: Negative.  Negative for dysuria.  Skin: Negative.   Neurological: Negative.     Allergies as of 02/17/2019   No Known Allergies     Medication List       Accurate as of February 17, 2019  4:45 PM. If you have any questions, ask your nurse or doctor.        cetirizine 10 MG tablet Commonly known as: ZYRTEC Take 10 mg by mouth daily.    valACYclovir 500 MG tablet Commonly known as: VALTREX Take one tab daily for prevention. For outbreaks take 4 tabs (2g) q12h for 2 doses.          Objective:    BP 113/76   Pulse (!) 57   Temp 97.7 F (36.5 C) (Oral)   Ht _1  (1.651 m)   Wt 119 lb 6.4 oz (54.2 kg)   BMI 19.87 kg/m   No Known Allergies  Wt Readings from Last 3 Encounters:  02/17/19 119 lb 6.4 oz (54.2 kg)  08/25/18 133 lb (60.3 kg)  07/01/17 146 lb 9.6 oz (66.5 kg)    Physical Exam Constitutional:      Appearance: She is well-developed.  HENT:     Head: Normocephalic and atraumatic.  Eyes:     Conjunctiva/sclera: Conjunctivae normal.     Pupils: Pupils are equal, round, and reactive to light.  Neck:     Musculoskeletal: Normal range of motion and neck supple.  Cardiovascular:     Rate and Rhythm: Normal rate and regular rhythm.     Heart sounds: Normal heart sounds.  Pulmonary:     Effort: Pulmonary effort is normal.     Breath sounds: Normal breath sounds.  Chest:     Breasts: Breasts are symmetrical.        Right: No mass, skin change or tenderness.  Left: No mass, skin change or tenderness.  Abdominal:     General: Bowel sounds are normal.     Palpations: Abdomen is soft.  Genitourinary:    Labia:        Right: No tenderness or lesion.        Left: No tenderness or lesion.      Vagina: Normal. No vaginal discharge, tenderness or bleeding.     Cervix: No cervical motion tenderness, discharge or friability.     Uterus: Not deviated, not enlarged and not tender.      Adnexa:        Right: No mass, tenderness or fullness.         Left: No mass, tenderness or fullness.       Rectum: No anal fissure.  Skin:    General: Skin is warm and dry.     Findings: No rash.  Neurological:     Mental Status: She is alert and oriented to person, place, and time.     Deep Tendon Reflexes: Reflexes are normal and symmetric.  Psychiatric:        Behavior: Behavior normal.        Thought  Content: Thought content normal.        Judgment: Judgment normal.     Results for orders placed or performed in visit on 08/25/18  GC/Chlamydia Probe Amp   Specimen: Urine   URINE  Result Value Ref Range   Chlamydia trachomatis, NAA Negative Negative   Neisseria gonorrhoeae by PCR Negative Negative  HIV Antibody (routine testing w rflx)  Result Value Ref Range   HIV Screen 4th Generation wRfx Non Reactive Non Reactive  RPR  Result Value Ref Range   RPR Ser Ql Reactive (A) Non Reactive  Urine Microscopic  Result Value Ref Range   WBC, UA 0-5 0 - 5 /hpf   RBC, UA None seen 0 - 2 /hpf   Epithelial Cells (non renal) 0-10 0 - 10 /hpf   Renal Epithel, UA None seen None seen /hpf   Bacteria, UA None seen None seen/Few  RPR, quant & T.pallidum antibodies  Result Value Ref Range   Rapid Plasma Reagin, Quant 1:1 (H) NonRea<1:1   T Pallidum Abs Non Reactive Non Reactive      Assessment & Plan:   1. Well adult exam - Tdap vaccine greater than or equal to 7yo IM - valACYclovir (VALTREX) 500 MG tablet; Take one tab daily for prevention. For outbreaks take 4 tabs (2g) q12h for 2 doses.  Dispense: 180 tablet; Refill: 3 - CBC with Differential/Platelet - CMP14+EGFR - Lipid panel - TSH     Continue all other maintenance medications as listed above.  Follow up plan: No follow-ups on file.  Educational handout given for well adult  Terald Sleeper PA-C [facility] [address]  [place], [place] [address] [phone]   02/17/2019, 4:45 PM

## 2019-02-18 LAB — CMP14+EGFR
ALT: 13 IU/L (ref 0–32)
AST: 13 IU/L (ref 0–40)
Albumin/Globulin Ratio: 2.1 (ref 1.2–2.2)
Albumin: 4.7 g/dL (ref 3.8–4.9)
Alkaline Phosphatase: 77 IU/L (ref 39–117)
BUN/Creatinine Ratio: 13 (ref 9–23)
BUN: 8 mg/dL (ref 6–24)
Bilirubin Total: 0.4 mg/dL (ref 0.0–1.2)
CO2: 25 mmol/L (ref 20–29)
Calcium: 9.6 mg/dL (ref 8.7–10.2)
Chloride: 104 mmol/L (ref 96–106)
Creatinine, Ser: 0.64 mg/dL (ref 0.57–1.00)
GFR calc Af Amer: 119 mL/min/{1.73_m2} (ref >59)
GFR calc non Af Amer: 103 mL/min/{1.73_m2} (ref >59)
Globulin, Total: 2.2 g/dL (ref 1.5–4.5)
Glucose: 88 mg/dL (ref 65–99)
Potassium: 4.1 mmol/L (ref 3.5–5.2)
Sodium: 143 mmol/L (ref 134–144)
Total Protein: 6.9 g/dL (ref 6.0–8.5)

## 2019-02-18 LAB — LIPID PANEL
Chol/HDL Ratio: 2 ratio (ref 0.0–4.4)
Cholesterol, Total: 156 mg/dL (ref 100–199)
HDL: 78 mg/dL (ref >39)
LDL Calculated: 66 mg/dL (ref 0–99)
Triglycerides: 61 mg/dL (ref 0–149)
VLDL Cholesterol Cal: 12 mg/dL (ref 5–40)

## 2019-02-18 LAB — CBC WITH DIFFERENTIAL/PLATELET
Basophils Absolute: 0 10*3/uL (ref 0.0–0.2)
Basos: 0 %
EOS (ABSOLUTE): 0.1 10*3/uL (ref 0.0–0.4)
Eos: 1 %
Hematocrit: 35.1 % (ref 34.0–46.6)
Hemoglobin: 11.5 g/dL (ref 11.1–15.9)
Immature Grans (Abs): 0 10*3/uL (ref 0.0–0.1)
Immature Granulocytes: 0 %
Lymphocytes Absolute: 1.7 10*3/uL (ref 0.7–3.1)
Lymphs: 31 %
MCH: 30.5 pg (ref 26.6–33.0)
MCHC: 32.8 g/dL (ref 31.5–35.7)
MCV: 93 fL (ref 79–97)
Monocytes Absolute: 0.4 10*3/uL (ref 0.1–0.9)
Monocytes: 8 %
Neutrophils Absolute: 3.3 10*3/uL (ref 1.4–7.0)
Neutrophils: 60 %
Platelets: 167 10*3/uL (ref 150–450)
RBC: 3.77 x10E6/uL (ref 3.77–5.28)
RDW: 12.4 % (ref 11.7–15.4)
WBC: 5.5 10*3/uL (ref 3.4–10.8)

## 2019-02-18 LAB — TSH: TSH: 2.04 u[IU]/mL (ref 0.450–4.500)

## 2019-02-19 ENCOUNTER — Other Ambulatory Visit: Payer: Self-pay | Admitting: *Deleted

## 2019-02-19 DIAGNOSIS — Z0000 Encounter for general adult medical examination without abnormal findings: Secondary | ICD-10-CM

## 2019-02-19 MED ORDER — VALACYCLOVIR HCL 500 MG PO TABS: ORAL_TABLET | ORAL | 3 refills | Status: DC

## 2019-02-26 LAB — IGP, APTIMA HPV, RFX 16/18,45: HPV Aptima: NEGATIVE

## 2019-02-27 ENCOUNTER — Ambulatory Visit (INDEPENDENT_AMBULATORY_CARE_PROVIDER_SITE_OTHER): Payer: No Typology Code available for payment source | Admitting: Physician Assistant

## 2019-02-27 ENCOUNTER — Encounter: Payer: Self-pay | Admitting: Physician Assistant

## 2019-02-27 ENCOUNTER — Other Ambulatory Visit: Payer: Self-pay

## 2019-02-27 DIAGNOSIS — R590 Localized enlarged lymph nodes: Secondary | ICD-10-CM | POA: Diagnosis not present

## 2019-02-27 NOTE — Progress Notes (Signed)
    Telephone visit  Subjective: CC: Enlarged lymph node PCP: Terald Sleeper, PA-Stacy [name] is a 53 y.o. female calls for telephone consult today. Patient provides verbal consent for consult held via phone.  Patient is identified with 2 separate identifiers.  At this time the entire area is on COVID-19 social distancing and stay home orders are in place.  Patient is of higher risk and therefore we are performing this by a virtual method.  Location of patient: Home Location of provider: HOME Others present for call: None  The past week patient has had an enlarged left anterior cervical lymph node.  It is palpable and she is quite concerned about it.  She does have allergies but she continues to take her Zyrtec.  States she states that they are not any worse than they normally are.  She denies any fevers, chills, weight loss.  Her mother did have a submandibular node removed by Dr. Benjamine Mola.  She has not been able to talk or see her mother lately.  The patient reports that she would like to have a ear nose and throat referral just to have it evaluated.  Her most recent labs were performed in our office this month.  And they were all normal.   ROS: Per HPI  No Known Allergies Past Medical History:  Diagnosis Date  . GERD (gastroesophageal reflux disease)   . H/O varicella   . Headache(784.0)    Frequently  . History of measles   . Migraine, menstrual 02/14/07  . Psychosocial stressors 11/23/10  . Yeast infection     Current Outpatient Medications:  .  cetirizine (ZYRTEC) 10 MG tablet, Take 10 mg by mouth daily., Disp: , Rfl:  .  valACYclovir (VALTREX) 500 MG tablet, Take one tab daily for prevention. For outbreaks take 4 tabs (2g) q12h for 2 doses., Disp: 180 tablet, Rfl: 3  Assessment/ Plan: 53 y.o. female   1. Enlarged lymph node in neck - Ambulatory referral to ENT   No follow-ups on file.  Continue all other maintenance medications as listed above.  Start time:  2:13 PM End time: 2:24 PM  No orders of the defined types were placed in this encounter.   Stacy Nearing PA-Stacy Wright [phone]

## 2019-03-03 ENCOUNTER — Other Ambulatory Visit: Payer: Self-pay | Admitting: Physician Assistant

## 2019-03-03 ENCOUNTER — Encounter: Payer: Self-pay | Admitting: Physician Assistant

## 2019-03-03 DIAGNOSIS — Z0000 Encounter for general adult medical examination without abnormal findings: Secondary | ICD-10-CM

## 2019-03-03 MED ORDER — VALACYCLOVIR HCL 500 MG PO TABS: ORAL_TABLET | ORAL | 3 refills | Status: DC

## 2019-04-09 ENCOUNTER — Other Ambulatory Visit: Payer: Self-pay

## 2019-04-09 ENCOUNTER — Ambulatory Visit (INDEPENDENT_AMBULATORY_CARE_PROVIDER_SITE_OTHER): Payer: PRIVATE HEALTH INSURANCE | Admitting: Otolaryngology

## 2019-04-09 DIAGNOSIS — R590 Localized enlarged lymph nodes: Secondary | ICD-10-CM | POA: Diagnosis not present

## 2019-04-09 DIAGNOSIS — D487 Neoplasm of uncertain behavior of other specified sites: Secondary | ICD-10-CM | POA: Diagnosis not present

## 2019-04-09 DIAGNOSIS — K219 Gastro-esophageal reflux disease without esophagitis: Secondary | ICD-10-CM | POA: Diagnosis not present

## 2019-07-23 ENCOUNTER — Telehealth: Payer: Self-pay | Admitting: Physician Assistant

## 2019-07-23 NOTE — Telephone Encounter (Signed)
Patient is dclinging to speak with anyone, but Stacy Wright she is aware Stacy Wright is not here. She will wait till she returns

## 2019-07-24 NOTE — Telephone Encounter (Signed)
No answer. Okay to get her questions if needed

## 2019-12-29 ENCOUNTER — Other Ambulatory Visit: Payer: Self-pay | Admitting: *Deleted

## 2019-12-29 DIAGNOSIS — Z0000 Encounter for general adult medical examination without abnormal findings: Secondary | ICD-10-CM

## 2019-12-29 MED ORDER — VALACYCLOVIR HCL 500 MG PO TABS: ORAL_TABLET | ORAL | 0 refills | Status: DC

## 2020-02-17 ENCOUNTER — Other Ambulatory Visit: Payer: Self-pay | Admitting: Physician Assistant

## 2020-02-17 DIAGNOSIS — Z1231 Encounter for screening mammogram for malignant neoplasm of breast: Secondary | ICD-10-CM

## 2020-02-26 ENCOUNTER — Other Ambulatory Visit: Payer: Self-pay | Admitting: Physician Assistant

## 2020-02-26 DIAGNOSIS — Z1231 Encounter for screening mammogram for malignant neoplasm of breast: Secondary | ICD-10-CM

## 2020-03-11 ENCOUNTER — Telehealth: Payer: Self-pay | Admitting: Physician Assistant

## 2020-03-11 NOTE — Telephone Encounter (Signed)
Last labs mailed to patient

## 2020-03-13 ENCOUNTER — Other Ambulatory Visit: Payer: Self-pay | Admitting: Family Medicine

## 2020-03-13 DIAGNOSIS — Z0000 Encounter for general adult medical examination without abnormal findings: Secondary | ICD-10-CM

## 2020-05-04 ENCOUNTER — Ambulatory Visit
Admission: RE | Admit: 2020-05-04 | Discharge: 2020-05-04 | Disposition: A | Discharge status: Home / Self Care | Payer: PRIVATE HEALTH INSURANCE | Source: Ambulatory Visit | Attending: Allergy | Admitting: Allergy

## 2020-05-04 ENCOUNTER — Encounter: Payer: Self-pay | Admitting: Family Medicine

## 2020-05-04 ENCOUNTER — Other Ambulatory Visit: Payer: Self-pay

## 2020-05-04 ENCOUNTER — Ambulatory Visit (INDEPENDENT_AMBULATORY_CARE_PROVIDER_SITE_OTHER): Payer: No Typology Code available for payment source | Admitting: Family Medicine

## 2020-05-04 VITALS — BP 100/61 | HR 68 | Temp 97.8°F | Ht 65.0 in | Wt 117.0 lb

## 2020-05-04 DIAGNOSIS — Z0001 Encounter for general adult medical examination with abnormal findings: Secondary | ICD-10-CM | POA: Diagnosis not present

## 2020-05-04 DIAGNOSIS — E559 Vitamin D deficiency, unspecified: Secondary | ICD-10-CM | POA: Diagnosis not present

## 2020-05-04 DIAGNOSIS — Z1231 Encounter for screening mammogram for malignant neoplasm of breast: Secondary | ICD-10-CM

## 2020-05-04 DIAGNOSIS — Z13228 Encounter for screening for other metabolic disorders: Secondary | ICD-10-CM

## 2020-05-04 DIAGNOSIS — Z23 Encounter for immunization: Secondary | ICD-10-CM

## 2020-05-04 DIAGNOSIS — N951 Menopausal and female climacteric states: Secondary | ICD-10-CM

## 2020-05-04 DIAGNOSIS — Z13220 Encounter for screening for lipoid disorders: Secondary | ICD-10-CM

## 2020-05-04 DIAGNOSIS — B001 Herpesviral vesicular dermatitis: Secondary | ICD-10-CM | POA: Diagnosis not present

## 2020-05-04 DIAGNOSIS — Z0000 Encounter for general adult medical examination without abnormal findings: Secondary | ICD-10-CM

## 2020-05-04 NOTE — Progress Notes (Signed)
Stacy Wright is a 54 y.o. female presents to office today for annual physical exam examination.    Concerns today include: 1. Hot flashes Patient reports occasional hot flashes that have been ongoing since 2013, when she was perimenopausal. She is now postmenopausal. She is sexually active in a monogamous relationship. Denies any abnormal uterine bleeding, postcoital bleeding, dyspareunia. 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  Occupation: Department of Orthoptist, Marital status: Has a significant other, Substance use: None. She is a former smoker that quit over 5 years ago Diet: Balanced. She has clean eating habits, Exercise: Very physically active Last colonoscopy: UTD Last mammogram: UTD Last pap smear: UTD Refills needed today: none Immunizations needed: Flu  Past Medical History:  Diagnosis Date   GERD (gastroesophageal reflux disease)    H/O varicella    Headache(784.0)    Frequently   History of measles    Migraine, menstrual 02/14/07   Psychosocial stressors 11/23/10   Yeast infection    Social History   Socioeconomic History   Marital status: Married    Spouse name: Not on file   Number of children: Not on file   Years of education: Not on file   Highest education level: Not on file  Occupational History   Not on file  Tobacco Use   Smoking status: Former Smoker    Quit date: 10/27/2013    Years since quitting: 6.5   Smokeless tobacco: Never Used  Vaping Use   Vaping Use: Never used  Substance and Sexual Activity   Alcohol use: No   Drug use: No   Sexual activity: Yes    Birth control/protection: Surgical    Comment: BTL  Other Topics Concern   Not on file  Social History Narrative   Not on file   Social Determinants of Health   Financial Resource Strain:    Difficulty of Paying Living Expenses: Not on file  Food Insecurity:    Worried About Charity fundraiser in the  Last Year: Not on file   [employer] of Food in the Last Year: Not on file  Transportation Needs:    Lack of Transportation (Medical): Not on file   Lack of Transportation (Non-Medical): Not on file  Physical Activity:    Days of Exercise per Week: Not on file   Minutes of Exercise per Session: Not on file  Stress:    Feeling of Stress : Not on file  Social Connections:    Frequency of Communication with Friends and Family: Not on file   Frequency of Social Gatherings with Friends and Family: Not on file   Attends Religious Services: Not on file   Active Member of Clubs or Organizations: Not on file   Attends Archivist Meetings: Not on file   Marital Status: Not on file  Intimate Partner Violence:    Fear of Current or Ex-Partner: Not on file   Emotionally Abused: Not on file   Physically Abused: Not on file   Sexually Abused: Not on file   Past Surgical History:  Procedure Laterality Date   DILATION AND CURETTAGE OF UTERUS     TUBAL LIGATION     No family history on file.  Current Outpatient Medications:    cetirizine (ZYRTEC) 10 MG tablet, Take 10 mg by mouth daily., Disp: , Rfl:    valACYclovir (VALTREX) 500 MG tablet, TAKE 1 TABLET BY MOUTH  DAILY FOR  PREVENTION. FOR  OUTBREAKS TAKE 4 TABLETS BY MOUTH EVERY 12 HOURS FOR 2  DOSES, Disp: 180 tablet, Rfl: 0  No Known Allergies   ROS: Review of Systems A comprehensive review of systems was negative.    Physical exam BP 100/61    Pulse 68    Temp 97.8 F (36.6 C)    Ht '5\' 5"'  (1.651 m)    Wt 117 lb (53.1 kg)    LMP 04/01/2012    SpO2 100%    BMI 19.47 kg/m  General appearance: alert, cooperative, appears stated age and no distress Head: Normocephalic, without obvious abnormality, atraumatic Eyes: negative findings: lids and lashes normal, conjunctivae and sclerae normal, corneas clear and pupils equal, round, reactive to light and accomodation Ears: normal TM's and external ear canals both  ears Nose: Nares normal. Septum midline. Mucosa normal. No drainage or sinus tenderness. Throat: lips, mucosa, and tongue normal; teeth and gums normal Neck: no adenopathy, no carotid bruit, supple, symmetrical, trachea midline and thyroid not enlarged, symmetric, no tenderness/mass/nodules Back: symmetric, no curvature. ROM normal. No CVA tenderness. Lungs: clear to auscultation bilaterally Heart: regular rate and rhythm, S1, S2 normal, no murmur, click, rub or gallop Abdomen: soft, non-tender; bowel sounds normal; no masses,  no organomegaly and thin body habitus, aortic pulse palpable in supine position Extremities: extremities normal, atraumatic, no cyanosis or edema Pulses: 2+ and symmetric Skin: Skin color, texture, turgor normal. No rashes or lesions Lymph nodes: Cervical, supraclavicular, and axillary nodes normal. Neurologic: Grossly normal GU: No palpable uterine masses or ovarian masses externally. Psych: Mood stable, speech normal, affect appropriate, pleasant and interactive   Assessment/ Plan: Stacy Wright here for annual physical exam.   1. Annual physical exam Up-to-date on preventative care.  2. 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 Renewal sent to place on file at her pharmacy  4. Hot flashes due to menopause Discussed naturopathic ways to help with this including black cohosh, foods with estrogen like properties. If symptoms become worse or more frequent, we could consider treatment with topical estrogen versus gabapentin versus Effexor. At this time she is not having any the mood disturbances associated with menopause so I hesitate to place her on any medications at this time.  5. Screening for metabolic disorder - XEN40+HWKG; Future - TSH; Future - TSH - CMP14+EGFR  6. Screening for lipid disorders -  Lipid panel; Future - Lipid panel  7. Need for immunization against influenza Administered - Flu Vaccine QUAD 36+ mos IM  Meds ordered this encounter  Medications   valACYclovir (VALTREX) 500 MG tablet    Sig: TAKE 1 TABLET BY MOUTH  DAILY FOR PREVENTION. FOR  OUTBREAKS TAKE 4 TABLETS BY MOUTH EVERY 12 HOURS FOR 2  DOSES    Dispense:  180 tablet    Refill:  0    Place on file/   Counseled on healthy lifestyle choices, including diet (rich in fruits, vegetables and lean meats and low in salt and simple carbohydrates) and exercise (at least 30 minutes of moderate physical activity daily).  Patient to follow up in 1 year for annual exam or sooner if needed.  Ishi Danser M. Lajuana Ripple, DO

## 2020-05-04 NOTE — Patient Instructions (Signed)
You had labs performed today.  You will be contacted with the results of the labs once they are available, usually in the next 3 business days for routine lab work.  If you have an active my chart account, they will be released to your MyChart.  If you prefer to have these labs released to you via telephone, please let us know.  If you had a pap smear or biopsy performed, expect to be contacted in about 7-10 days.   Preventive Care 27-53 Years Old, Female Preventive care refers to visits with your health care provider and lifestyle choices that can promote health and wellness. This includes:  A yearly physical exam. This may also be called an annual well check.  Regular dental visits and eye exams.  Immunizations.  Screening for certain conditions.  Healthy lifestyle choices, such as eating a healthy diet, getting regular exercise, not using drugs or products that contain nicotine and tobacco, and limiting alcohol use. What can I expect for my preventive care visit? Physical exam Your health care provider will check your:  Height and weight. This may be used to calculate body mass index (BMI), which tells if you are at a healthy weight.  Heart rate and blood pressure.  Skin for abnormal spots. Counseling Your health care provider may ask you questions about your:  Alcohol, tobacco, and drug use.  Emotional well-being.  Home and relationship well-being.  Sexual activity.  Eating habits.  Work and work Statistician.  Method of birth control.  Menstrual cycle.  Pregnancy history. What immunizations do I need?  Influenza (flu) vaccine  This is recommended every year. Tetanus, diphtheria, and pertussis (Tdap) vaccine  You may need a Td booster every 10 years. Varicella (chickenpox) vaccine  You may need this if you have not been vaccinated. Zoster (shingles) vaccine  You may need this after age 67. Measles, mumps, and rubella (MMR) vaccine  You may need at  least one dose of MMR if you were born in 1957 or later. You may also need a second dose. Pneumococcal conjugate (PCV13) vaccine  You may need this if you have certain conditions and were not previously vaccinated. Pneumococcal polysaccharide (PPSV23) vaccine  You may need one or two doses if you smoke cigarettes or if you have certain conditions. Meningococcal conjugate (MenACWY) vaccine  You may need this if you have certain conditions. Hepatitis A vaccine  You may need this if you have certain conditions or if you travel or work in places where you may be exposed to hepatitis A. Hepatitis B vaccine  You may need this if you have certain conditions or if you travel or work in places where you may be exposed to hepatitis B. Haemophilus influenzae type b (Hib) vaccine  You may need this if you have certain conditions. Human papillomavirus (HPV) vaccine  If recommended by your health care provider, you may need three doses over 6 months. You may receive vaccines as individual doses or as more than one vaccine together in one shot (combination vaccines). Talk with your health care provider about the risks and benefits of combination vaccines. What tests do I need? Blood tests  Lipid and cholesterol levels. These may be checked every 5 years, or more frequently if you are over 27 years old.  Hepatitis C test.  Hepatitis B test. Screening  Lung cancer screening. You may have this screening every year starting at age 40 if you have a 30-pack-year history of smoking and currently smoke or  have quit within the past 15 years.  Colorectal cancer screening. All adults should have this screening starting at age 81 and continuing until age 15. Your health care provider may recommend screening at age 25 if you are at increased risk. You will have tests every 1-10 years, depending on your results and the type of screening test.  Diabetes screening. This is done by checking your blood sugar  (glucose) after you have not eaten for a while (fasting). You may have this done every 1-3 years.  Mammogram. This may be done every 1-2 years. Talk with your health care provider about when you should start having regular mammograms. This may depend on whether you have a family history of breast cancer.  BRCA-related cancer screening. This may be done if you have a family history of breast, ovarian, tubal, or peritoneal cancers.  Pelvic exam and Pap test. This may be done every 3 years starting at age 58. Starting at age 15, this may be done every 5 years if you have a Pap test in combination with an HPV test. Other tests  Sexually transmitted disease (STD) testing.  Bone density scan. This is done to screen for osteoporosis. You may have this scan if you are at high risk for osteoporosis. Follow these instructions at home: Eating and drinking  Eat a diet that includes fresh fruits and vegetables, whole grains, lean protein, and low-fat dairy.  Take vitamin and mineral supplements as recommended by your health care provider.  Do not drink alcohol if: ? Your health care provider tells you not to drink. ? You are pregnant, may be pregnant, or are planning to become pregnant.  If you drink alcohol: ? Limit how much you have to 0-1 drink a day. ? Be aware of how much alcohol is in your drink. In the [place] one drink equals one 12 oz bottle of beer (355 mL), one 5 oz glass of wine (148 mL), or one 1 oz glass of hard liquor (44 mL). Lifestyle  Take daily care of your teeth and gums.  Stay active. Exercise for at least 30 minutes on 5 or more days each week.  Do not use any products that contain nicotine or tobacco, such as cigarettes, e-cigarettes, and chewing tobacco. If you need help quitting, ask your health care provider.  If you are sexually active, practice safe sex. Use a condom or other form of birth control (contraception) in order to prevent pregnancy and STIs (sexually  transmitted infections).  If told by your health care provider, take low-dose aspirin daily starting at age 27. What's next?  Visit your health care provider once a year for a well check visit.  Ask your health care provider how often you should have your eyes and teeth checked.  Stay up to date on all vaccines. This information is not intended to replace advice given to you by your health care provider. Make sure you discuss any questions you have with your health care provider. Document Revised: 04/03/2018 Document Reviewed: 04/03/2018 Elsevier Patient Education  2020 [employer].

## 2020-05-05 ENCOUNTER — Other Ambulatory Visit: Payer: Self-pay | Admitting: Family Medicine

## 2020-05-05 DIAGNOSIS — B001 Herpesviral vesicular dermatitis: Secondary | ICD-10-CM | POA: Insufficient documentation

## 2020-05-05 LAB — CMP14+EGFR
ALT: 17 IU/L (ref 0–32)
AST: 18 IU/L (ref 0–40)
Albumin/Globulin Ratio: 1.7 (ref 1.2–2.2)
Albumin: 4.5 g/dL (ref 3.8–4.9)
Alkaline Phosphatase: 92 IU/L (ref 44–121)
BUN/Creatinine Ratio: 16 (ref 9–23)
BUN: 10 mg/dL (ref 6–24)
Bilirubin Total: 0.3 mg/dL (ref 0.0–1.2)
CO2: 24 mmol/L (ref 20–29)
Calcium: 9.9 mg/dL (ref 8.7–10.2)
Chloride: 104 mmol/L (ref 96–106)
Creatinine, Ser: 0.64 mg/dL (ref 0.57–1.00)
GFR calc Af Amer: 118 mL/min/{1.73_m2} (ref >59)
GFR calc non Af Amer: 102 mL/min/{1.73_m2} (ref >59)
Globulin, Total: 2.6 g/dL (ref 1.5–4.5)
Glucose: 96 mg/dL (ref 65–99)
Potassium: 3.8 mmol/L (ref 3.5–5.2)
Sodium: 142 mmol/L (ref 134–144)
Total Protein: 7.1 g/dL (ref 6.0–8.5)

## 2020-05-05 LAB — LIPID PANEL
Chol/HDL Ratio: 2 ratio (ref 0.0–4.4)
Cholesterol, Total: 164 mg/dL (ref 100–199)
HDL: 84 mg/dL (ref >39)
LDL Chol Calc (NIH): 69 mg/dL (ref 0–99)
Triglycerides: 51 mg/dL (ref 0–149)
VLDL Cholesterol Cal: 11 mg/dL (ref 5–40)

## 2020-05-05 LAB — VITAMIN D 25 HYDROXY (VIT D DEFICIENCY, FRACTURES): Vit D, 25-Hydroxy: 24.4 ng/mL — ABNORMAL LOW (ref 30.0–100.0)

## 2020-05-05 LAB — TSH: TSH: 2.12 u[IU]/mL (ref 0.450–4.500)

## 2020-05-05 MED ORDER — VALACYCLOVIR HCL 500 MG PO TABS: ORAL_TABLET | ORAL | 0 refills | Status: DC

## 2020-05-05 MED ORDER — CHOLECALCIFEROL 1.25 MG (50000 UT) PO CAPS: 50000.0000 [IU] | ORAL_CAPSULE | ORAL | 0 refills | Status: AC

## 2020-05-06 ENCOUNTER — Telehealth: Payer: Self-pay

## 2020-05-06 NOTE — Telephone Encounter (Signed)
Patient aware of results and verbalizes understanding.  

## 2020-05-06 NOTE — Telephone Encounter (Signed)
REFERRAL REQUEST Telephone Note  Have you been seen at our office for this problem? yes (Advise that they may need an appointment with their PCP before a referral can be done)  Reason for Referral: Dr said she should get ultrasound since she is a smoker/ pt said that insurance will not pay for in hospital ultrasound but will pay for in dr office. I told pt that we didn't have ultrasound. She wants to know if it can be coded where she will not have to pay Referral discussed with patient: yes Best contact number of patient for referral team [phone] Has patient been seen by a specialist for this issue before: no Patient provider preference for referral: na Patient location preference for referral: na   Patient notified that referrals can take up to a week or longer to process. If they haven't heard anything within a week they should call back and speak with the referral department.

## 2020-05-06 NOTE — Telephone Encounter (Signed)
So what was told to patient is that since she has been a smoker ever in her lifetime, this was a recommended procedure after age 53 in men.  For women, evidence is indeterminate but I will offer it to women if there is a family history of cardiovascular disease. So in a nutshell, she is not due for this for at least 10 years.

## 2020-05-06 NOTE — Telephone Encounter (Signed)
Patient aware and verbalized understanding. °

## 2020-06-13 ENCOUNTER — Telehealth: Payer: Self-pay

## 2020-06-13 NOTE — Telephone Encounter (Signed)
Pt called stating that when she saw Dr Lajuana Ripple at her last visit, she talked with her about pre-menopause. Says Dr Lajuana Ripple recommended she try [facility] for hot flashes. Pt says she tried it but felt like it made symptom worse. Wants to know what else she can try and also what all she recommends for all Menopausal symptoms.

## 2020-06-14 NOTE — Telephone Encounter (Signed)
Patient would like to try topical estrogen please send  To [facility]

## 2020-06-14 NOTE — Telephone Encounter (Signed)
Yes, we could try topical estrogen cream vaginally if she wants. Let me know where she would like this sent.

## 2020-06-14 NOTE — Telephone Encounter (Signed)
Not aware of any other naturopathic medications.  If she wants to try something prescription let me know.  Gabapentin is helpful in vasomotor symptoms of menopause.

## 2020-06-14 NOTE — Telephone Encounter (Signed)
Patient aware and states that unsure of taking Gabapentin.  Patient would like to know if Estrogen would help

## 2020-07-11 ENCOUNTER — Other Ambulatory Visit: Payer: Self-pay | Admitting: Family Medicine

## 2020-07-11 DIAGNOSIS — B001 Herpesviral vesicular dermatitis: Secondary | ICD-10-CM

## 2021-05-17 ENCOUNTER — Encounter: Payer: No Typology Code available for payment source | Admitting: Family Medicine

## 2021-05-24 ENCOUNTER — Encounter: Payer: No Typology Code available for payment source | Admitting: Family Medicine

## 2021-07-10 ENCOUNTER — Encounter: Payer: No Typology Code available for payment source | Admitting: Family Medicine

## 2021-08-29 ENCOUNTER — Other Ambulatory Visit (HOSPITAL_COMMUNITY)
Admission: RE | Admit: 2021-08-29 | Discharge: 2021-08-29 | Disposition: A | Discharge status: Home / Self Care | Payer: No Typology Code available for payment source | Source: Ambulatory Visit | Attending: Family Medicine | Admitting: Family Medicine

## 2021-08-29 ENCOUNTER — Encounter: Payer: Self-pay | Admitting: Family Medicine

## 2021-08-29 ENCOUNTER — Ambulatory Visit (INDEPENDENT_AMBULATORY_CARE_PROVIDER_SITE_OTHER): Payer: No Typology Code available for payment source | Admitting: Family Medicine

## 2021-08-29 VITALS — BP 109/59 | HR 68 | Temp 98.0°F | Ht 65.0 in | Wt 119.8 lb

## 2021-08-29 DIAGNOSIS — Z124 Encounter for screening for malignant neoplasm of cervix: Secondary | ICD-10-CM

## 2021-08-29 DIAGNOSIS — Z01419 Encounter for gynecological examination (general) (routine) without abnormal findings: Secondary | ICD-10-CM

## 2021-08-29 DIAGNOSIS — N898 Other specified noninflammatory disorders of vagina: Secondary | ICD-10-CM | POA: Insufficient documentation

## 2021-08-29 DIAGNOSIS — B001 Herpesviral vesicular dermatitis: Secondary | ICD-10-CM

## 2021-08-29 DIAGNOSIS — Z01411 Encounter for gynecological examination (general) (routine) with abnormal findings: Secondary | ICD-10-CM

## 2021-08-29 DIAGNOSIS — Z23 Encounter for immunization: Secondary | ICD-10-CM | POA: Diagnosis not present

## 2021-08-29 DIAGNOSIS — E559 Vitamin D deficiency, unspecified: Secondary | ICD-10-CM | POA: Diagnosis not present

## 2021-08-29 MED ORDER — VALACYCLOVIR HCL 500 MG PO TABS: 500.0000 mg | ORAL_TABLET | Freq: Every day | ORAL | 3 refills | Status: DC

## 2021-08-29 NOTE — Progress Notes (Signed)
Stacy Wright is a 56 y.o. female presents to office today for annual physical exam examination.    Marital status: monogamous relationship, Substance use: none Diet: balanced, home prepared foods, Exercise: very active Last eye exam: UTD Last dental exam: UTD Last colonoscopy: soon Last mammogram: UTD Last pap smear: wants to have done today Refills needed today: valtrex Immunizations needed: Immunization History  Administered Date(s) Administered   Influenza,inj,Quad PF,6+ Mos 05/03/2017, 05/04/2020   Influenza-Unspecified 05/16/2018   Moderna Sars-Covid-2 Vaccination 08/12/2019, 09/09/2019   Tdap 02/17/2019     Past Medical History:  Diagnosis Date   GERD (gastroesophageal reflux disease)    H/O varicella    Headache(784.0)    Frequently   History of measles    Migraine, menstrual 02/14/07   Psychosocial stressors 11/23/10   Yeast infection    Social History   Socioeconomic History   Marital status: Married    Spouse name: Not on file   Number of children: Not on file   Years of education: Not on file   Highest education level: Not on file  Occupational History   Not on file  Tobacco Use   Smoking status: Former    Types: Cigarettes    Quit date: 10/27/2013    Years since quitting: 7.8   Smokeless tobacco: Never  Vaping Use   Vaping Use: Never used  Substance and Sexual Activity   Alcohol use: No   Drug use: No   Sexual activity: Yes    Birth control/protection: Surgical    Comment: BTL  Other Topics Concern   Not on file  Social History Narrative   Not on file   Social Determinants of Health   Financial Resource Strain: Not on file  Food Insecurity: Not on file  Transportation Needs: Not on file  Physical Activity: Not on file  Stress: Not on file  Social Connections: Not on file  Intimate Partner Violence: Not on file   Past Surgical History:  Procedure Laterality Date   DILATION AND CURETTAGE OF UTERUS     TUBAL LIGATION     History  reviewed. No pertinent family history.  Current Outpatient Medications:    cetirizine (ZYRTEC) 10 MG tablet, Take 10 mg by mouth daily., Disp: , Rfl:    ofloxacin (FLOXIN) 0.3 % OTIC solution, SMARTSIG:10 Drop(s) Left Ear Daily, Disp: , Rfl:    valACYclovir (VALTREX) 500 MG tablet, TAKE 1 TABLET BY MOUTH  DAILY FOR PREVENTION. FOR  OUTBREAKS TAKE 4 TABLETS BY MOUTH EVERY 12 HOURS FOR 2  DOSES, Disp: 180 tablet, Rfl: 1  No Known Allergies   ROS: Review of Systems A comprehensive review of systems was negative except for: Integument/breast: positive for cold sores    Physical exam BP (!) 109/59    Pulse 68    Temp 98 F (36.7 C)    Ht 5\' 5"  (1.651 m)    Wt 119 lb 12.8 oz (54.3 kg)    LMP 04/01/2012    SpO2 98%    BMI 19.94 kg/m  General appearance: alert, cooperative, appears stated age, and no distress Head: Normocephalic, without obvious abnormality, atraumatic Eyes: negative findings: lids and lashes normal, conjunctivae and sclerae normal, corneas clear, and pupils equal, round, reactive to light and accomodation Ears: normal TM's and external ear canals both ears Nose: Nares normal. Septum midline. Mucosa normal. No drainage or sinus tenderness. Throat: lips, mucosa, and tongue normal; teeth and gums normal Neck: no adenopathy, no carotid bruit, supple, symmetrical, trachea midline,  and thyroid not enlarged, symmetric, no tenderness/mass/nodules Back: symmetric, no curvature. ROM normal. No CVA tenderness. Lungs: clear to auscultation bilaterally Heart: regular rate and rhythm, S1, S2 normal, no murmur, click, rub or gallop Abdomen: soft, non-tender; bowel sounds normal; no masses,  no organomegaly Extremities: extremities normal, atraumatic, no cyanosis or edema Pulses: 2+ and symmetric Skin: Skin color, texture, turgor normal. No rashes or lesions Lymph nodes: Cervical, supraclavicular, and axillary nodes normal. Neurologic: Grossly normal GU: Cervix to the patient's right.  No  appreciable punctate lesions, discharge.  External vaginal mucosa normal in appearance.  No lesions.  Depression screen [facility] 2/9 08/29/2021 05/04/2020 02/17/2019  Decreased Interest 0 0 0  Down, Depressed, Hopeless 0 0 0  PHQ - 2 Score 0 0 0  Altered sleeping - - -  Tired, decreased energy - - -  Change in appetite - - -  Feeling bad or failure about yourself  - - -  Trouble concentrating - - -  Moving slowly or fidgety/restless - - -  Suicidal thoughts - - -  PHQ-9 Score - - -  Difficult doing work/chores - - -   GAD 7 : Generalized Anxiety Score 08/29/2021  Nervous, Anxious, on Edge 0  Control/stop worrying 0  Worry too much - different things 0  Trouble relaxing 0  Restless 0  Easily annoyed or irritable 0  Afraid - awful might happen 0  Total GAD 7 Score 0  Anxiety Difficulty Not difficult at all    Assessment/ Plan: Lang Snow here for annual physical exam.   Well woman exam with routine gynecological exam  Screening for malignant neoplasm of cervix - Plan: Cytology - PAP  Vaginal irritation - Plan: Cytology - PAP  Vitamin D insufficiency  Need for shingles vaccine  Herpes labialis - Plan: valACYclovir (VALTREX) 500 MG tablet  Pap smear performed today.  Technically not due till July.  No red flag signs or symptoms.  Assuming that her Pap smear is normal we will repeat this again with cotesting in 3 years.  Vitamin D insufficiency being treated with OTC vitamin D.  Continue adequate dietary sources.  Plan for DEXA scan next year.  She will be 5 years postmenopausal at that time  Shingles vaccination #1 administer.  She would likely have the second 1 done at the health department  Valtrex renewed for daily use.  Patient needs chronic suppression given recurrence  Patient to follow up in 1 year for annual exam or sooner if needed.  Lorrie Gargan M. Lajuana Ripple, DO

## 2021-08-29 NOTE — Patient Instructions (Addendum)
You had labs performed today.  You will be contacted with the results of the labs once they are available, usually in the next 3 business days for routine lab work.  If you have an active my chart account, they will be released to your MyChart.  If you prefer to have these labs released to you via telephone, please let us know.  If you had a pap smear or biopsy performed, expect to be contacted in about 7-10 days.  Your labs from 04/2021 showed Vit D insufficiency.  You should be intaking 400 IU of Vit D daily either through nutritional sources or via over the counter vitamins.  Pap Test Why am I having this test? A Pap test, also called a Pap smear, is a screening test to check for signs of: Infection. Cancer of the cervix. The cervix is the lower part of the uterus that opens into the vagina. Changes that may be a sign that cancer is developing (precancerous changes). Women need this test on a regular basis. In general, you should have a Pap test every 3 years until you reach menopause or age 70. Women aged 30-60 may choose to have their Pap test done at the same time as an HPV (human papillomavirus) test every 5 years (instead of every 3 years). Your health care provider may recommend having Pap tests more or less often depending on your medical conditions and past Pap test results. What is being tested? Cervical cells are tested for signs of infection or abnormalities. What kind of sample is taken? Your health care provider will collect a sample of cells from the surface of your cervix. This will be done using a small cotton swab, plastic spatula, or brush that is inserted into your vagina using a tool called a speculum. This sample is often collected during a pelvic exam, when you are lying on your back on an exam table with your feet in footrests (stirrups). In some cases, fluids (secretions) from the cervix or vagina may also be collected. How do I prepare for this test? Be aware of where  you are in your menstrual cycle. If you are menstruating on the day of the test, you may be asked to reschedule. You may need to reschedule if you have a known vaginal infection on the day of the test. Follow instructions from your health care provider about: Changing or stopping your regular medicines. Some medicines can cause abnormal test results, such as vaginal medicines and tetracycline. Avoiding douching 2-3 days before or the day of the test. Tell a health care provider about: Any allergies you have. All medicines you are taking, including vitamins, herbs, eye drops, creams, and over-the-counter medicines. Any bleeding problems you have. Any surgeries you have had. Any medical conditions you have. Whether you are pregnant or may be pregnant. How are the results reported? Your test results will be reported as either abnormal or normal. What do the results mean? A normal test result means that you do not have signs of cancer of the cervix. An abnormal result may mean that you have: Cancer. A Pap test by itself is not enough to diagnose cancer. You will have more tests done if cancer is suspected. Precancerous changes in your cervix. Inflammation of the cervix. An STI (sexually transmitted infection). A fungal infection. A parasite infection. Talk with your health care provider about what your results mean. In some cases, your health care provider may do more testing to confirm the results. Questions to  ask your health care provider Ask your health care provider, or the department that is doing the test: When will my results be ready? How will I get my results? What are my treatment options? What other tests do I need? What are my next steps? Summary In general, women should have a Pap test every 3 years until they reach menopause or age 30. Your health care provider will collect a sample of cells from the surface of your cervix. This will be done using a small cotton swab,  plastic spatula, or brush. In some cases, fluids (secretions) from the cervix or vagina may also be collected. This information is not intended to replace advice given to you by your health care provider. Make sure you discuss any questions you have with your health care provider. Document Revised: 10/21/2020 Document Reviewed: 10/21/2020 Elsevier Patient Education  [place].

## 2021-08-30 ENCOUNTER — Other Ambulatory Visit: Payer: Self-pay | Admitting: Family Medicine

## 2021-08-30 ENCOUNTER — Telehealth: Payer: Self-pay | Admitting: Family Medicine

## 2021-08-30 MED ORDER — IBUPROFEN 600 MG PO TABS: 600.0000 mg | ORAL_TABLET | Freq: Three times a day (TID) | ORAL | 0 refills | Status: DC | PRN

## 2021-08-30 NOTE — Telephone Encounter (Signed)
Seen yesterday, forgot to mention request for IBU 600 mg to [facility] Please advise not on med list

## 2021-08-30 NOTE — Telephone Encounter (Signed)
done

## 2021-08-30 NOTE — Telephone Encounter (Signed)
°  Prescription Request  08/30/2021  Is this a "Controlled Substance" medicine? no  Have you seen your PCP in the last 2 weeks? Pt was seen yesterday and forgot to tell dr g  If YES, route message to pool  -  If NO, patient needs to be scheduled for appointment.  What is the name of the medication or equipment? Ibuprofin 600 mg  Have you contacted your pharmacy to request a refill? no   Which pharmacy would you like this sent to? [facility]   Patient notified that their request is being sent to the clinical staff for review and that they should receive a response within 2 business days.

## 2021-08-31 LAB — CYTOLOGY - PAP
Comment: NEGATIVE
Diagnosis: NEGATIVE
High risk HPV: NEGATIVE

## 2021-09-21 ENCOUNTER — Other Ambulatory Visit: Payer: Self-pay | Admitting: Family Medicine

## 2021-12-27 ENCOUNTER — Telehealth: Payer: Self-pay | Admitting: Family Medicine

## 2021-12-27 NOTE — Telephone Encounter (Signed)
na

## 2021-12-27 NOTE — Telephone Encounter (Signed)
I'm not sure the source of her information but to my knowledge the exact OPPOSITE is true.  LOW vit d is associated with INCREASED risk of heart disease.  I have printed out a medical paper with this information if she'd like to review.  I'd love to know where she is getting this misinformation though so that I can review it myself.

## 2021-12-27 NOTE — Telephone Encounter (Signed)
Copy mailed.

## 2021-12-27 NOTE — Telephone Encounter (Signed)
Please advise 

## 2022-01-17 NOTE — Telephone Encounter (Signed)
Attempts to contact pt without return call in over 3 days, will close encounter. 

## 2022-04-26 ENCOUNTER — Telehealth: Payer: Self-pay | Admitting: Family Medicine

## 2022-04-26 NOTE — Telephone Encounter (Signed)
Pharmacy changed

## 2022-04-26 NOTE — Telephone Encounter (Signed)
Pt called to let us know that she changed pharmacies and needs all of her refills sent to [facility].

## 2022-05-02 ENCOUNTER — Other Ambulatory Visit: Payer: Self-pay

## 2022-05-02 DIAGNOSIS — B001 Herpesviral vesicular dermatitis: Secondary | ICD-10-CM

## 2022-05-02 MED ORDER — VALACYCLOVIR HCL 500 MG PO TABS: 500.0000 mg | ORAL_TABLET | Freq: Every day | ORAL | 3 refills | Status: DC

## 2022-05-02 MED ORDER — IBUPROFEN 600 MG PO TABS: 600.0000 mg | ORAL_TABLET | Freq: Three times a day (TID) | ORAL | 2 refills | Status: DC | PRN

## 2022-05-02 NOTE — Telephone Encounter (Signed)
Pt calling back about this message. Rx have not been sent in.

## 2022-05-02 NOTE — Telephone Encounter (Signed)
Meds sent

## 2022-08-12 IMAGING — MG DIGITAL SCREENING BILAT W/ TOMO W/ CAD
8 series · 9 of 24 positions shown · non-contrast
Comparison: Previous exam(s).

CLINICAL DATA: Screening.

EXAM:
DIGITAL SCREENING BILATERAL MAMMOGRAM WITH TOMO AND CAD

[L CC synth-2D]
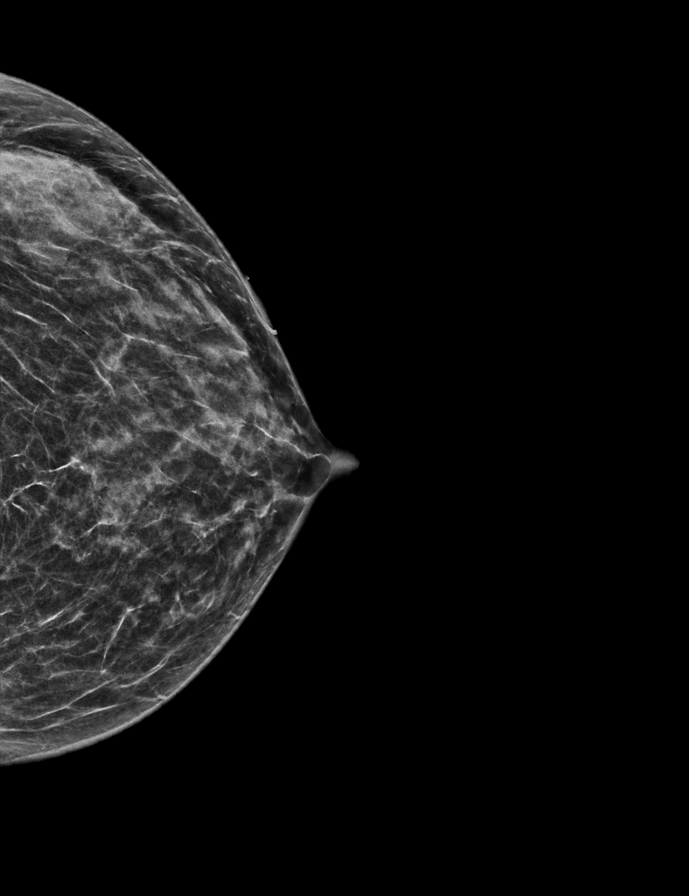

[R MLO synth-2D]
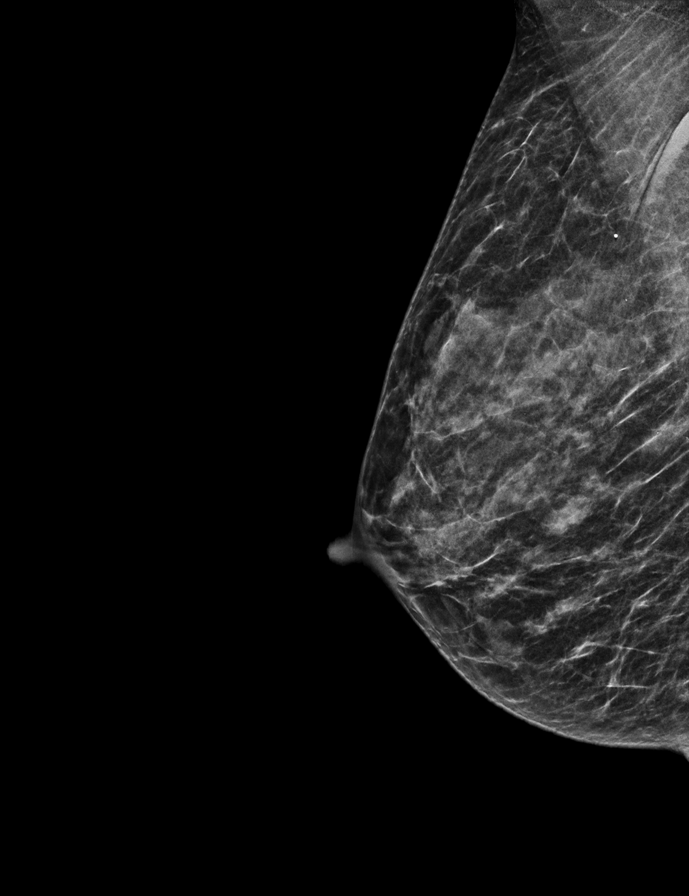

[L MLO synth-2D]
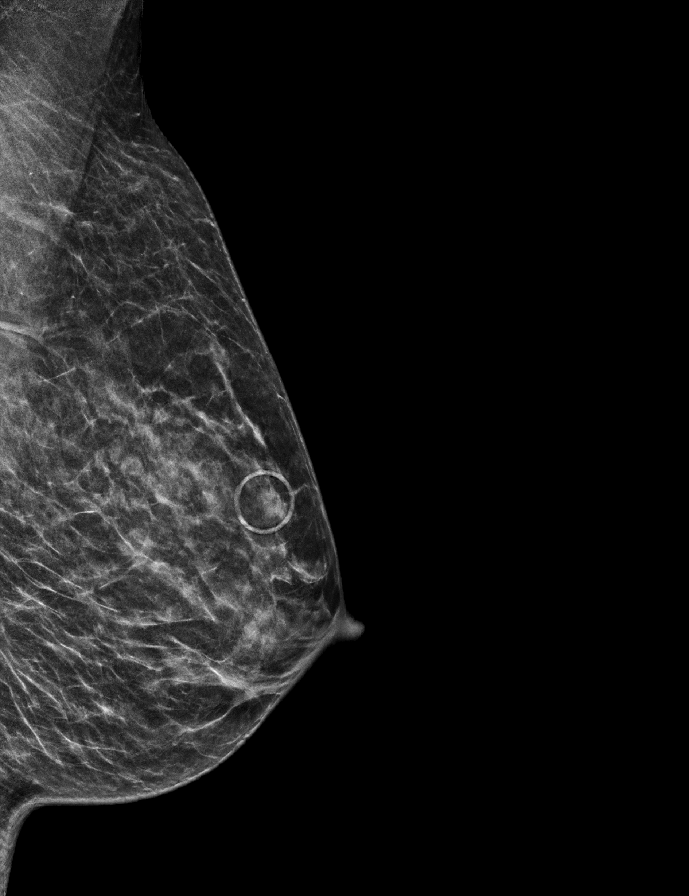

[R CC synth-2D]
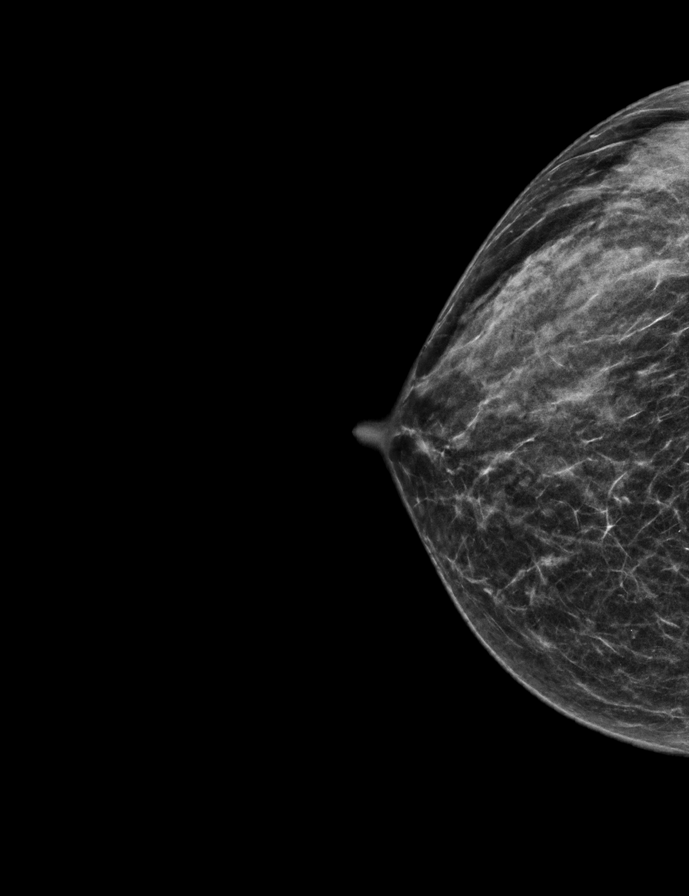

[R CC tomo · 2 of 42 frames shown]
[frame 14/42]
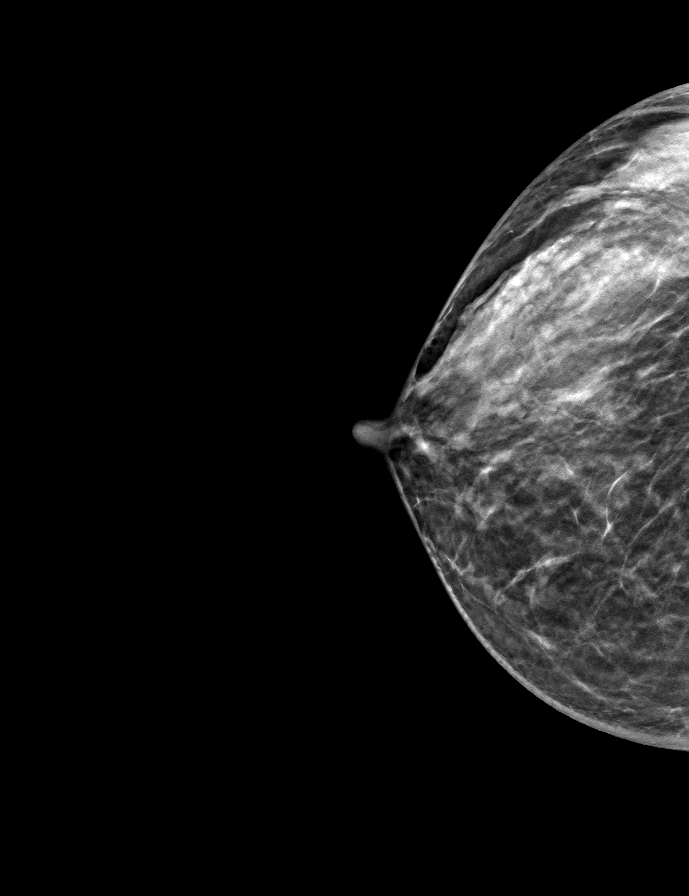
[frame 21/42]
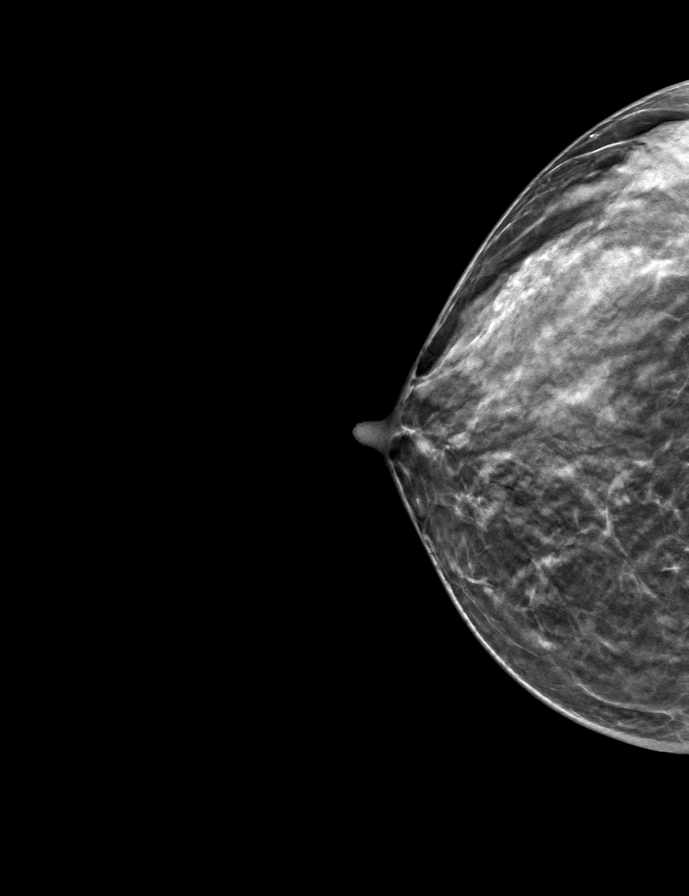

[L CC tomo · tomo slice 22/43.0]
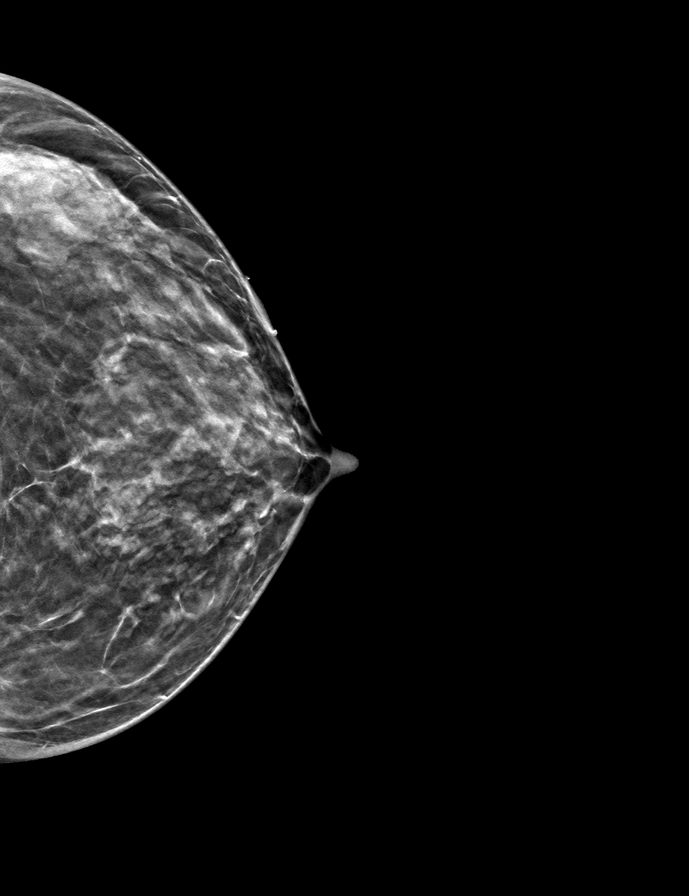

[R MLO tomo · tomo slice 21/41.0]
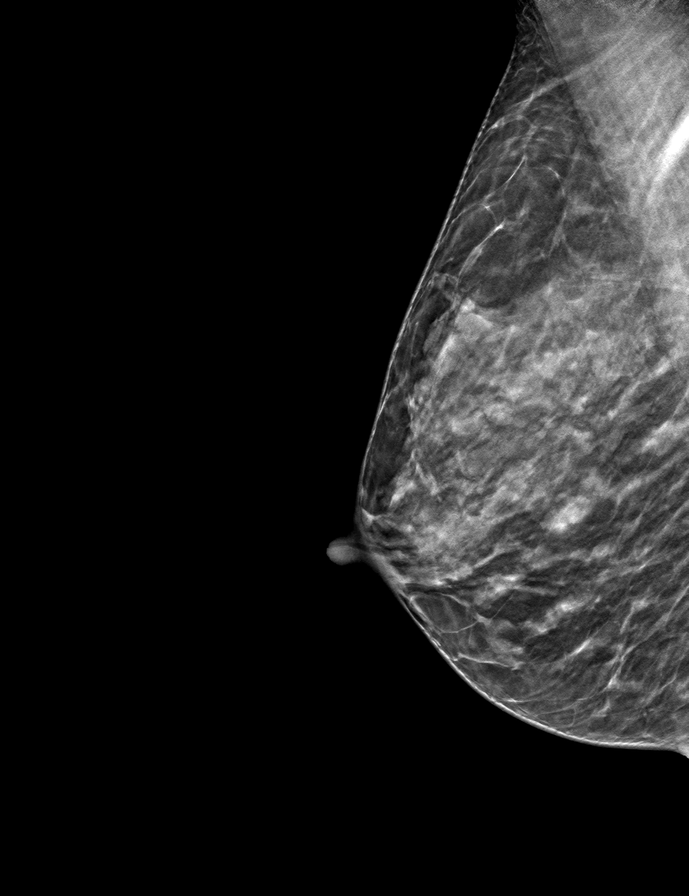

[L MLO tomo · tomo slice 21/41.0]
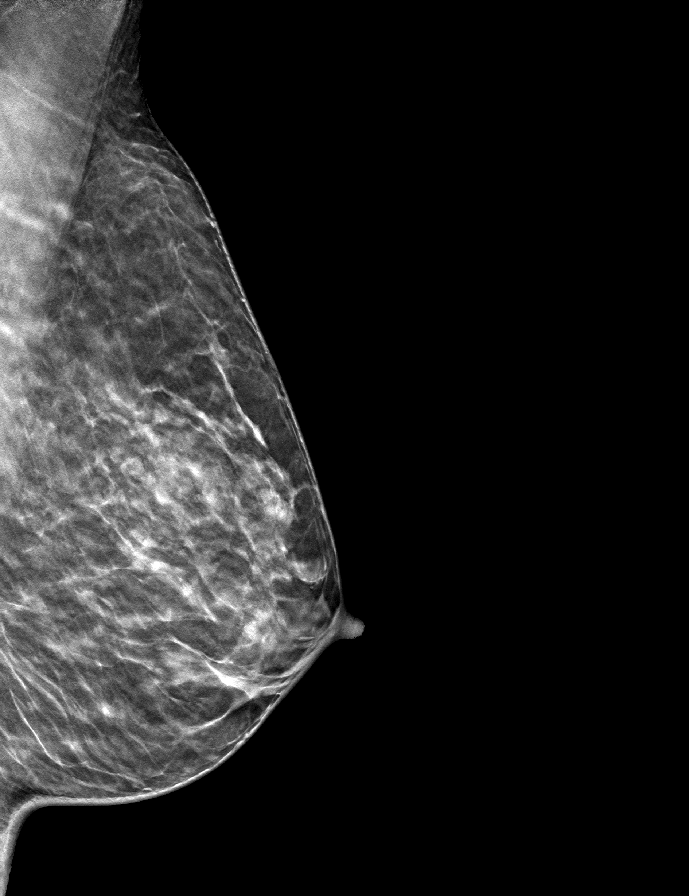

[9 of 24 positions shown; findings below may reference images not displayed]

ACR Breast Density Category c: The breast tissue is heterogeneously
dense, which may obscure small masses.
FINDINGS: There are no findings suspicious for malignancy. Images were
processed with CAD.
IMPRESSION: No mammographic evidence of malignancy. A result letter of this
screening mammogram will be mailed directly to the patient.

RECOMMENDATION:
Screening mammogram in one year. (Code:[id number])

BI-RADS CATEGORY  1: Negative.

## 2022-10-26 LAB — HM MAMMOGRAPHY

## 2022-11-09 ENCOUNTER — Telehealth: Payer: Self-pay | Admitting: Family Medicine

## 2022-11-09 NOTE — Telephone Encounter (Signed)
Pt aware.

## 2022-11-09 NOTE — Telephone Encounter (Signed)
Pt called requesting Rx for Estrogen. Says Dr Nadine Counts recommended it to her one time before but she never took it. Explained to pt that she needed an appt to get a new Rx for anything. Pt declined and wanted me to send Dr Nadine Counts and message to see if she would send Rx for her. Says she has been getting UTI's.

## 2022-12-12 ENCOUNTER — Ambulatory Visit: Payer: No Typology Code available for payment source | Admitting: Family Medicine

## 2022-12-12 ENCOUNTER — Encounter: Payer: Self-pay | Admitting: Family Medicine

## 2022-12-12 VITALS — BP 94/57 | HR 77 | Temp 98.6°F | Ht 65.0 in | Wt 118.0 lb

## 2022-12-12 DIAGNOSIS — Z8249 Family history of ischemic heart disease and other diseases of the circulatory system: Secondary | ICD-10-CM

## 2022-12-12 DIAGNOSIS — N951 Menopausal and female climacteric states: Secondary | ICD-10-CM

## 2022-12-12 MED ORDER — VEOZAH 45 MG PO TABS
1.0000 | ORAL_TABLET | Freq: Every day | ORAL | 3 refills | Status: DC
Start: 2022-12-12 — End: 2023-11-14

## 2022-12-12 NOTE — Progress Notes (Signed)
Subjective: CC: Discussion of hormone therapy PCP: Raliegh Ip, DO WUJ:WJXBJ Rudie is a 57 y.o. female presenting to clinic today for:  1.  Hot flashes Patient reports that she has been experiencing hot flashes more frequently.  She has tried some of the over-the-counter supplements in efforts to alleviate some of these postmenopausal symptoms.  She is considered patch estrogen versus topical estrogen but she worries about topical estrogen potentially interfering with ability to have intercourse.  She does not want as she denies her partner.  Her main concern is the hot flashes.  She does not report any vaginal atrophy or irritation.  No postcoital bleeding.  Does not report any mood disturbances  2.  Desires cardiac screening Patient is interested in coronary artery calcium scoring.  She notes that there is a prevalence of hypertension, hyperlipidemia within the family.  Her father passed away from complications of an aneurysm.  No known MRI.  She tries to leave a fairly healthy lifestyle and stay physically active but wants peace of mind because she does not really know the full history of the family   ROS: Per HPI  No Known Allergies Past Medical History:  Diagnosis Date   GERD (gastroesophageal reflux disease)    H/O varicella    Headache(784.0)    Frequently   History of measles    Migraine, menstrual 02/14/07   Psychosocial stressors 11/23/10   Yeast infection     Current Outpatient Medications:    cetirizine (ZYRTEC) 10 MG tablet, Take 10 mg by mouth daily., Disp: , Rfl:    cholecalciferol (VITAMIN D3) 25 MCG (1000 UNIT) tablet, Take 1,000 Units by mouth daily., Disp: , Rfl:    ibuprofen (ADVIL) 600 MG tablet, Take 1 tablet (600 mg total) by mouth every 8 (eight) hours as needed., Disp: 90 tablet, Rfl: 2   Multiple Vitamin (MULTIVITAMIN) tablet, Take 1 tablet by mouth daily., Disp: , Rfl:    Multiple Vitamins-Minerals (HAIR SKIN AND NAILS FORMULA PO), Take by mouth.,  Disp: , Rfl:    valACYclovir (VALTREX) 500 MG tablet, Take 1 tablet (500 mg total) by mouth daily. For chronic suppression, Disp: 90 tablet, Rfl: 3   vitamin k 100 MCG tablet, Take 100 mcg by mouth daily., Disp: , Rfl:  Social History   Socioeconomic History   Marital status: Widowed    Spouse name: Not on file   Number of children: Not on file   Years of education: Not on file   Highest education level: Not on file  Occupational History   Not on file  Tobacco Use   Smoking status: Former    Types: Cigarettes    Quit date: 10/27/2013    Years since quitting: 9.1   Smokeless tobacco: Never  Vaping Use   Vaping Use: Never used  Substance and Sexual Activity   Alcohol use: No   Drug use: No   Sexual activity: Yes    Birth control/protection: Surgical    Comment: BTL  Other Topics Concern   Not on file  Social History Narrative   Not on file   Social Determinants of Health   Financial Resource Strain: Not on file  Food Insecurity: Not on file  Transportation Needs: Not on file  Physical Activity: Not on file  Stress: Not on file  Social Connections: Not on file  Intimate Partner Violence: Not on file   History reviewed. No pertinent family history.  Objective: Office vital signs reviewed. BP (!) 94/57  Pulse 77   Temp 98.6 F (37 C)   Ht 5\' 5"  (1.651 m)   Wt 118 lb (53.5 kg)   LMP 04/01/2012   SpO2 97%   BMI 19.64 kg/m   Physical Examination:  General: Awake, alert, well nourished, No acute distress HEENT: Normal    Neck: No masses palpated. No lymphadenopathy    Ears: Tympanic membranes intact, normal light reflex, no erythema, no bulging    Eyes: PERRLA, extraocular membranes intact, sclera white    Nose: nasal turbinates moist, clear nasal discharge    Throat: moist mucus membranes, no erythema, no tonsillar exudate.  Airway is patent Cardio: regular rate and rhythm, S1S2 heard, no murmurs appreciated Pulm: clear to auscultation bilaterally, no  wheezes, rhonchi or rales; normal work of breathing on room air GI: soft, non-tender, non-distended, bowel sounds present x4, no hepatomegaly, no splenomegaly, no masses  Assessment/ Plan: 57 y.o. female   Hot flashes due to menopause - Plan: Fezolinetant (VEOZAH) 45 MG TABS  Family history of cardiac disorder - Plan: CT CARDIAC SCORING (SELF PAY ONLY)  We discussed trial of the Oza.  I think a nonhormonal seems to be more along the lines of what she is looking for.  I am certainly glad to do topical estrogens I think this is a relatively low risk if kept at a low dose.  She would likely not need progestin if vaginal estrogens are pursued.  CT coronary artery calcium score ordered.  She will follow-up with me in August for annual physical fasting labs  Orders Placed This Encounter  Procedures   [facility] MAMMOGRAPHY    This external order was created through the Results Console.   No orders of the defined types were placed in this encounter.    Raliegh Ip, DO [facility] [phone]

## 2023-01-14 ENCOUNTER — Ambulatory Visit: Payer: No Typology Code available for payment source | Admitting: Family Medicine

## 2023-01-22 ENCOUNTER — Ambulatory Visit (HOSPITAL_BASED_OUTPATIENT_CLINIC_OR_DEPARTMENT_OTHER)
Admission: RE | Admit: 2023-01-22 | Discharge: 2023-01-22 | Disposition: A | Discharge status: Home / Self Care | Payer: No Typology Code available for payment source | Source: Ambulatory Visit | Attending: Family Medicine | Admitting: Family Medicine

## 2023-01-22 DIAGNOSIS — Z8249 Family history of ischemic heart disease and other diseases of the circulatory system: Secondary | ICD-10-CM | POA: Insufficient documentation

## 2023-02-04 ENCOUNTER — Telehealth: Payer: Self-pay

## 2023-02-04 ENCOUNTER — Telehealth: Payer: Self-pay | Admitting: Family Medicine

## 2023-02-04 DIAGNOSIS — Z780 Asymptomatic menopausal state: Secondary | ICD-10-CM

## 2023-02-04 NOTE — Telephone Encounter (Signed)
Patient made appointment for DXA and labs on 03/30/2023. I advised patient to make sure with insurance that DXA is covered. Can you please review and place order for DXA and labs.

## 2023-02-04 NOTE — Telephone Encounter (Signed)
This has already been sent to provider for orders - please see note in chart

## 2023-02-12 NOTE — Addendum Note (Signed)
Addended by: Raliegh Ip on: 02/12/2023 04:51 PM   Modules accepted: Orders

## 2023-03-18 ENCOUNTER — Encounter: Payer: No Typology Code available for payment source | Admitting: Family Medicine

## 2023-03-25 ENCOUNTER — Ambulatory Visit (INDEPENDENT_AMBULATORY_CARE_PROVIDER_SITE_OTHER): Payer: Managed Care, Other (non HMO)

## 2023-03-25 DIAGNOSIS — Z780 Asymptomatic menopausal state: Secondary | ICD-10-CM | POA: Diagnosis not present

## 2023-03-26 ENCOUNTER — Other Ambulatory Visit: Payer: Self-pay | Admitting: Family Medicine

## 2023-03-26 DIAGNOSIS — E559 Vitamin D deficiency, unspecified: Secondary | ICD-10-CM

## 2023-03-26 DIAGNOSIS — M810 Age-related osteoporosis without current pathological fracture: Secondary | ICD-10-CM

## 2023-03-29 ENCOUNTER — Encounter: Payer: Self-pay | Admitting: Family Medicine

## 2023-03-29 ENCOUNTER — Other Ambulatory Visit: Payer: No Typology Code available for payment source

## 2023-03-29 ENCOUNTER — Telehealth: Payer: Self-pay | Admitting: Family Medicine

## 2023-03-29 ENCOUNTER — Ambulatory Visit (INDEPENDENT_AMBULATORY_CARE_PROVIDER_SITE_OTHER): Payer: Managed Care, Other (non HMO) | Admitting: Family Medicine

## 2023-03-29 DIAGNOSIS — M810 Age-related osteoporosis without current pathological fracture: Secondary | ICD-10-CM | POA: Diagnosis not present

## 2023-03-29 DIAGNOSIS — E559 Vitamin D deficiency, unspecified: Secondary | ICD-10-CM | POA: Diagnosis not present

## 2023-03-29 MED ORDER — EVENITY 105 MG/1.17ML ~~LOC~~ SOSY
210.0000 mg | PREFILLED_SYRINGE | SUBCUTANEOUS | 11 refills | Status: DC
Start: 2023-03-29 — End: 2023-04-05

## 2023-03-29 NOTE — Telephone Encounter (Signed)
I;ve already messaged her abck about this. Please see chart

## 2023-03-29 NOTE — Progress Notes (Signed)
Subjective: CC: osteoporosis PCP: Raliegh Ip, DO Stacy Wright is a 57 y.o. female presenting to clinic today for:  1. Osteoporosis New diagnosis for patient.  She reports having gone through menopause at age 16.  She has never been treated with high-dose steroids.  Was a former smoker intermittently.  She has had history of vitamin D in the past but really no other known risk factors for osteoporosis.  To her knowledge, her mother does not suffer from this.  She has been pretty consistent with vitamin D, calcium supplementation and/or consumption through diet.  She remains very physically active and has been trying incorporate more strengthening exercises into her regimen since diagnosis.  Worries about bony pain because she had some back pain with Veozah for hot flashes, though she does report that the [facility] worked well for the hot flashes.  Interested in Economist and/or Prolia.   ROS: Per HPI  No Known Allergies Past Medical History:  Diagnosis Date   GERD (gastroesophageal reflux disease)    H/O varicella    Headache(784.0)    Frequently   History of measles    Migraine, menstrual 02/14/07   Psychosocial stressors 11/23/10   Yeast infection     Current Outpatient Medications:    cetirizine (ZYRTEC) 10 MG tablet, Take 10 mg by mouth daily., Disp: , Rfl:    cholecalciferol (VITAMIN D3) 25 MCG (1000 UNIT) tablet, Take 1,000 Units by mouth daily., Disp: , Rfl:    Fezolinetant (VEOZAH) 45 MG TABS, Take 1 tablet (45 mg total) by mouth daily. For hot flashes, Disp: 90 tablet, Rfl: 3   ibuprofen (ADVIL) 600 MG tablet, Take 1 tablet (600 mg total) by mouth every 8 (eight) hours as needed., Disp: 90 tablet, Rfl: 2   Multiple Vitamin (MULTIVITAMIN) tablet, Take 1 tablet by mouth daily., Disp: , Rfl:    Multiple Vitamins-Minerals (HAIR SKIN AND NAILS FORMULA PO), Take by mouth., Disp: , Rfl:    valACYclovir (VALTREX) 500 MG tablet, Take 1 tablet (500 mg total) by mouth  daily. For chronic suppression, Disp: 90 tablet, Rfl: 3   vitamin k 100 MCG tablet, Take 100 mcg by mouth daily., Disp: , Rfl:  Social History   Socioeconomic History   Marital status: Widowed    Spouse name: Not on file   Number of children: Not on file   Years of education: Not on file   Highest education level: Not on file  Occupational History   Not on file  Tobacco Use   Smoking status: Former    Current packs/day: 0.00    Types: Cigarettes    Quit date: 10/27/2013    Years since quitting: 9.4   Smokeless tobacco: Never  Vaping Use   Vaping status: Never Used  Substance and Sexual Activity   Alcohol use: No   Drug use: No   Sexual activity: Yes    Birth control/protection: Surgical    Comment: BTL  Other Topics Concern   Not on file  Social History Narrative   Not on file   Social Determinants of Health   Financial Resource Strain: Not on file  Food Insecurity: Not on file  Transportation Needs: Not on file  Physical Activity: Not on file  Stress: Not on file  Social Connections: Unknown (12/18/2021)   [facility]   Social Network    Social Network: Not on file  Intimate Partner Violence: Unknown (11/07/2021)   [facility]   HITS  Physically Hurt: Not on file    Insult or Talk Down To: Not on file    Threaten Physical Harm: Not on file    Scream or Curse: Not on file   History reviewed. No pertinent family history.  Objective: Office vital signs reviewed. BP 117/63   Pulse 60   Temp 98.3 F (36.8 C)   Ht 5\' 5"  (1.651 m)   Wt 116 lb (52.6 kg)   LMP 04/01/2012   SpO2 94%   BMI 19.30 kg/m   Physical Examination:  General: Awake, alert, well nourished, fit female. No acute distress MSK: normal gait and station    Narrative & Impression  EXAM: DUAL X-RAY ABSORPTIOMETRY (DXA) FOR BONE MINERAL DENSITY 03/26/2023 9:32 am   CLINICAL DATA:  57 year old Female Postmenopausal. screen osteoporosis   TECHNIQUE: An  axial (e.g., hips, spine) and/or appendicular (e.g., radius) exam was performed, as appropriate, using [employer] systems engineer at [facility]. Images are obtained for bone mineral density measurement and are not obtained for diagnostic purposes. [id number]   Exclusions: None.   COMPARISON:  None.   FINDINGS: Scan quality: Good.   LUMBAR SPINE (L1-L4):   BMD (in g/cm2): 0.908   T-score: -2.3   Z-score: -1.0   LEFT FEMORAL NECK:   BMD (in g/cm2): 0.707   T-score: -2.4   Z-score: -1.1   LEFT TOTAL HIP:   BMD (in g/cm2): 0.644   T-score: -2.9   Z-score: -1.9   RIGHT FEMORAL NECK:   BMD (in g/cm2): 0.668   T-score: -2.7   Z-score: -1.3   RIGHT TOTAL HIP:   BMD (in g/cm2): 0.624   T-score: -3.0   Z-score: -2.1   FRAX 10-YEAR PROBABILITY OF FRACTURE:   FRAX not reported as the lowest BMD is not in the osteopenia range.   IMPRESSION: Osteoporosis based on BMD.   Fracture risk is increased. Increased risk is based on low BMD.   RECOMMENDATIONS: 1. All patients should optimize calcium and vitamin D intake.   2. Consider FDA-approved medical therapies in postmenopausal women and men aged 76 years and older, based on the following:   - A hip or vertebral (clinical or morphometric) fracture   - T-score less than or equal to -2.5 and secondary causes have been excluded.   - Low bone mass (T-score between -1.0 and -2.5) and a 10-year probability of a hip fracture greater than or equal to 3% or a 10-year probability of a major osteoporosis-related fracture greater than or equal to 20% based on the [place]-adapted WHO algorithm.   - Clinician judgment and/or patient preferences may indicate treatment for people with 10-year fracture probabilities above or below these levels   3. Patients with diagnosis of osteoporosis or at high risk for fracture should have regular bone mineral density tests. For patients eligible for  Medicare, routine testing is allowed once every 2 years. The testing frequency can be increased to one year for patients who have rapidly progressing disease, those who are receiving or discontinuing medical therapy to restore bone mass, or have additional risk factors.      Assessment/ Plan: 57 y.o. female   Age-related osteoporosis without current pathological fracture - Plan: CMP14+EGFR, VITAMIN D 25 Hydroxy (Vit-D Deficiency, Fractures), TSH, PTH, Intact and Calcium  Vitamin D insufficiency - Plan: CMP14+EGFR, VITAMIN D 25 Hydroxy (Vit-D Deficiency, Fractures), TSH, PTH, Intact and Calcium  T score of -3.0 at the right hip in this postmenopausal female with risk  factors of previous smoking history and Vit D deficiency.  Encouraged dietary sources of VIt D/ Calcium.   Will send additional handout.  Osteoporosis present despite healthy lifestyle/ weight baring exercise. Has a CAC score of 0 with no h/o MI.  I think a year on Evenity would be a good option for this patient. Then can consider switch to Prolia or bisphosphonate.   Check for metabolic abnormalities.  If Vit D low, will proceed with high dose supplementation.   Raliegh Ip, DO [facility] [phone]

## 2023-03-30 ENCOUNTER — Encounter: Payer: Self-pay | Admitting: Family Medicine

## 2023-03-30 LAB — TSH: TSH: 2.81 u[IU]/mL (ref 0.450–4.500)

## 2023-03-30 LAB — PTH, INTACT AND CALCIUM: PTH: 18 pg/mL (ref 15–65)

## 2023-03-30 LAB — CMP14+EGFR
ALT: 23 IU/L (ref 0–32)
AST: 20 IU/L (ref 0–40)
Albumin: 4.3 g/dL (ref 3.8–4.9)
Alkaline Phosphatase: 88 IU/L (ref 44–121)
BUN/Creatinine Ratio: 15 (ref 9–23)
BUN: 10 mg/dL (ref 6–24)
Bilirubin Total: 0.2 mg/dL (ref 0.0–1.2)
CO2: 25 mmol/L (ref 20–29)
Calcium: 9.9 mg/dL (ref 8.7–10.2)
Chloride: 105 mmol/L (ref 96–106)
Creatinine, Ser: 0.67 mg/dL (ref 0.57–1.00)
Globulin, Total: 2.1 g/dL (ref 1.5–4.5)
Glucose: 96 mg/dL (ref 70–99)
Potassium: 4.3 mmol/L (ref 3.5–5.2)
Sodium: 142 mmol/L (ref 134–144)
Total Protein: 6.4 g/dL (ref 6.0–8.5)
eGFR: 103 mL/min/{1.73_m2} (ref >59)

## 2023-03-30 LAB — VITAMIN D 25 HYDROXY (VIT D DEFICIENCY, FRACTURES): Vit D, 25-Hydroxy: 73.4 ng/mL (ref 30.0–100.0)

## 2023-04-02 LAB — LIPID PANEL W/O CHOL/HDL RATIO
Cholesterol, Total: 180 mg/dL (ref 100–199)
HDL: 93 mg/dL (ref >39)
LDL Chol Calc (NIH): 77 mg/dL (ref 0–99)
Triglycerides: 50 mg/dL (ref 0–149)
VLDL Cholesterol Cal: 10 mg/dL (ref 5–40)

## 2023-04-02 LAB — SPECIMEN STATUS REPORT

## 2023-04-03 ENCOUNTER — Telehealth: Payer: Self-pay | Admitting: Family Medicine

## 2023-04-03 ENCOUNTER — Encounter: Payer: Self-pay | Admitting: Family Medicine

## 2023-04-03 NOTE — Telephone Encounter (Signed)
Chart says Evenity. Is that what you wanted to continue with? If so I will forward to PA team

## 2023-04-03 NOTE — Telephone Encounter (Signed)
Evenity planned for a year then transition to prolia.  That is what we discussed and [facility] faxed order to [facility] in [place] as she requested

## 2023-04-03 NOTE — Telephone Encounter (Signed)
This has been resent

## 2023-04-03 NOTE — Telephone Encounter (Signed)
Stacy Wright, you faxed this rx.  Do you still have it?

## 2023-04-04 ENCOUNTER — Encounter: Payer: Self-pay | Admitting: Family Medicine

## 2023-04-05 ENCOUNTER — Other Ambulatory Visit: Payer: Self-pay

## 2023-04-05 DIAGNOSIS — M810 Age-related osteoporosis without current pathological fracture: Secondary | ICD-10-CM

## 2023-04-05 DIAGNOSIS — E559 Vitamin D deficiency, unspecified: Secondary | ICD-10-CM

## 2023-04-05 MED ORDER — EVENITY 105 MG/1.17ML ~~LOC~~ SOSY: 210.0000 mg | PREFILLED_SYRINGE | SUBCUTANEOUS | 11 refills | Status: DC

## 2023-04-09 NOTE — Telephone Encounter (Signed)
Pt called wanting to know if PA for this medication had been sent and if it was approved or not?

## 2023-04-15 ENCOUNTER — Telehealth: Payer: Self-pay

## 2023-04-15 ENCOUNTER — Telehealth: Payer: Self-pay | Admitting: Family Medicine

## 2023-04-15 ENCOUNTER — Other Ambulatory Visit (HOSPITAL_COMMUNITY): Payer: Self-pay

## 2023-04-15 NOTE — Telephone Encounter (Signed)
Evenity VOB initiated via [email or web address] 

## 2023-04-15 NOTE — Telephone Encounter (Signed)
 Created new encounter for Evenity BIV. Will route encounter back once benefit verification is complete.

## 2023-04-19 ENCOUNTER — Other Ambulatory Visit (HOSPITAL_COMMUNITY): Payer: Self-pay

## 2023-04-19 NOTE — Telephone Encounter (Signed)
Received call from [employer] about the PA. The representative said she spoke to the office but they have not received the documentation that was sent over. Faxed chart notes to [phone].

## 2023-04-22 NOTE — Telephone Encounter (Signed)
Patient calls in and states pharmacy told her the Stacy Wright had been approved by her insurance and wants to know what has to be done so she can get her shot. Can send it to [facility] in [place] since her mail order pharmacy don't have it. Aslo states the specialty pharmacy through her insurance sent a discount card to attached to her RX to [facility].

## 2023-04-23 ENCOUNTER — Other Ambulatory Visit (HOSPITAL_COMMUNITY): Payer: Self-pay

## 2023-04-24 ENCOUNTER — Other Ambulatory Visit (HOSPITAL_COMMUNITY): Payer: Self-pay

## 2023-05-01 NOTE — Telephone Encounter (Signed)
Evenity approved through 05/05/2024

## 2023-05-02 ENCOUNTER — Ambulatory Visit (INDEPENDENT_AMBULATORY_CARE_PROVIDER_SITE_OTHER): Payer: Managed Care, Other (non HMO) | Admitting: *Deleted

## 2023-05-02 DIAGNOSIS — M810 Age-related osteoporosis without current pathological fracture: Secondary | ICD-10-CM | POA: Diagnosis not present

## 2023-05-02 MED ORDER — ROMOSOZUMAB-AQQG 105 MG/1.17ML ~~LOC~~ SOSY
210.0000 mg | PREFILLED_SYRINGE | Freq: Once | SUBCUTANEOUS | Status: AC
  Administered 2023-05-02: 210 mg via SUBCUTANEOUS

## 2023-05-02 NOTE — Progress Notes (Signed)
Patient in today for monthly Evenity injection. Administered SQ in bilateral abdominal tissue. Patient tolerated well.

## 2023-05-07 NOTE — Telephone Encounter (Signed)
Given 05/02/23

## 2023-05-29 ENCOUNTER — Telehealth: Payer: Self-pay | Admitting: Family Medicine

## 2023-05-30 ENCOUNTER — Ambulatory Visit (INDEPENDENT_AMBULATORY_CARE_PROVIDER_SITE_OTHER): Payer: Managed Care, Other (non HMO) | Admitting: *Deleted

## 2023-05-30 DIAGNOSIS — M810 Age-related osteoporosis without current pathological fracture: Secondary | ICD-10-CM | POA: Diagnosis not present

## 2023-05-30 MED ORDER — ROMOSOZUMAB-AQQG 105 MG/1.17ML ~~LOC~~ SOSY
210.0000 mg | PREFILLED_SYRINGE | Freq: Once | SUBCUTANEOUS | Status: AC
Start: 2023-05-30 — End: 2023-05-30
  Administered 2023-05-30: 210 mg via SUBCUTANEOUS

## 2023-05-30 NOTE — Progress Notes (Signed)
Pt in today for her 2nd Evenity shot, she tolerated it well.

## 2023-06-19 ENCOUNTER — Other Ambulatory Visit: Payer: Self-pay | Admitting: *Deleted

## 2023-06-19 DIAGNOSIS — B001 Herpesviral vesicular dermatitis: Secondary | ICD-10-CM

## 2023-06-19 MED ORDER — IBUPROFEN 600 MG PO TABS: 600.0000 mg | ORAL_TABLET | Freq: Three times a day (TID) | ORAL | 0 refills | Status: AC | PRN

## 2023-06-19 NOTE — Addendum Note (Signed)
Addended by: Julious Payer D on: 06/19/2023 02:18 PM   Modules accepted: Orders

## 2023-06-20 MED ORDER — VALACYCLOVIR HCL 500 MG PO TABS: 500.0000 mg | ORAL_TABLET | Freq: Every day | ORAL | 3 refills | Status: DC

## 2023-06-28 ENCOUNTER — Ambulatory Visit (INDEPENDENT_AMBULATORY_CARE_PROVIDER_SITE_OTHER): Payer: Managed Care, Other (non HMO)

## 2023-06-28 DIAGNOSIS — M810 Age-related osteoporosis without current pathological fracture: Secondary | ICD-10-CM | POA: Diagnosis not present

## 2023-06-28 MED ORDER — ROMOSOZUMAB-AQQG 105 MG/1.17ML ~~LOC~~ SOSY
210.0000 mg | PREFILLED_SYRINGE | Freq: Once | SUBCUTANEOUS | Status: AC
  Administered 2023-06-28: 210 mg via SUBCUTANEOUS

## 2023-06-28 NOTE — Progress Notes (Signed)
Evenity injection given - 2 injections subcutaneous one in each thigh - patient tolerated well

## 2023-07-24 ENCOUNTER — Encounter: Payer: Self-pay | Admitting: Family Medicine

## 2023-07-26 ENCOUNTER — Ambulatory Visit (INDEPENDENT_AMBULATORY_CARE_PROVIDER_SITE_OTHER): Payer: Managed Care, Other (non HMO) | Admitting: Family Medicine

## 2023-07-26 ENCOUNTER — Telehealth: Payer: Self-pay | Admitting: Family Medicine

## 2023-07-26 DIAGNOSIS — E559 Vitamin D deficiency, unspecified: Secondary | ICD-10-CM | POA: Diagnosis not present

## 2023-07-26 DIAGNOSIS — M810 Age-related osteoporosis without current pathological fracture: Secondary | ICD-10-CM

## 2023-07-26 MED ORDER — ROMOSOZUMAB-AQQG 105 MG/1.17ML ~~LOC~~ SOSY
210.0000 mg | PREFILLED_SYRINGE | Freq: Once | SUBCUTANEOUS | Status: AC
Start: 2023-08-09 — End: 2023-07-26
  Administered 2023-07-26: 210 mg via SUBCUTANEOUS

## 2023-07-26 NOTE — Telephone Encounter (Signed)
When would you like her back to see you?

## 2023-07-26 NOTE — Telephone Encounter (Signed)
She does not have to come back in for labs for the evenity shot. She had them done in August (vit d and calcium were normal).  She should follow up for her normal yearly exam and we will repeat labs yearly.

## 2023-07-26 NOTE — Telephone Encounter (Signed)
Patient aware and verbalized understanding. °

## 2023-07-26 NOTE — Progress Notes (Signed)
Patient given Evenity and tolerated well.

## 2023-08-23 ENCOUNTER — Ambulatory Visit: Payer: Managed Care, Other (non HMO) | Admitting: *Deleted

## 2023-08-23 DIAGNOSIS — M810 Age-related osteoporosis without current pathological fracture: Secondary | ICD-10-CM

## 2023-08-23 MED ORDER — ROMOSOZUMAB-AQQG 105 MG/1.17ML ~~LOC~~ SOSY
210.0000 mg | PREFILLED_SYRINGE | Freq: Once | SUBCUTANEOUS | Status: AC
Start: 2023-09-06 — End: 2023-09-20
  Administered 2023-09-20: 210 mg via SUBCUTANEOUS

## 2023-08-23 NOTE — Progress Notes (Signed)
Patient is in office today for a nurse visit for  Evenity injection  in both sides of her abdomen. Injection was tolerated well by the patient. (See MAR for injection details)

## 2023-08-27 ENCOUNTER — Other Ambulatory Visit (HOSPITAL_COMMUNITY): Payer: Self-pay

## 2023-08-27 ENCOUNTER — Telehealth: Payer: Self-pay

## 2023-08-27 NOTE — Telephone Encounter (Signed)
Evenity VOB initiated via [email or web address]  Last Evenity inj: 07/26/23 Next Evenity inj DUE: 08/27/23

## 2023-08-30 NOTE — Telephone Encounter (Signed)
Marland Kitchen

## 2023-09-04 NOTE — Telephone Encounter (Signed)
Pt ready for scheduling for EVENITY on or after : 09/04/23  Out-of-pocket cost due at time of visit: $552 + DEDUCTIBLE  Number of injection/visits approved: 12  Primary: CIG[employer]-COMMERCIAL Prolia co-insurance: 20% Admin fee co-insurance: $70  Secondary: --- Prolia co-insurance:  Admin fee co-insurance:   Medical Benefit Details: Date Benefits were checked: 08/28/23 Deductible: $100.94 Met of $2000 Required/ Coinsurance: 20%/ Admin Fee: $70  Prior Auth: APPROVED PA#  Expiration Date: 05/01/23-05/05/24 # of doses approved: 12 Pharmacy benefit: Copay $--- If patient wants fill through the pharmacy benefit please send prescription to:  [facility] , and include estimated need by date in rx notes. Pharmacy will ship medication directly to the office.  Patient IS eligible for Evenity Copay Card. Copay Card can make patient's cost as little as $25. Link to apply: [email or web address]://[email or web address]  ** This summary of benefits is an estimation of the patient's out-of-pocket cost. Exact cost may very based on individual plan coverage.

## 2023-09-13 ENCOUNTER — Other Ambulatory Visit (HOSPITAL_COMMUNITY): Payer: Self-pay

## 2023-09-20 ENCOUNTER — Ambulatory Visit (INDEPENDENT_AMBULATORY_CARE_PROVIDER_SITE_OTHER): Payer: Managed Care, Other (non HMO) | Admitting: *Deleted

## 2023-09-20 DIAGNOSIS — M810 Age-related osteoporosis without current pathological fracture: Secondary | ICD-10-CM | POA: Diagnosis not present

## 2023-09-20 NOTE — Progress Notes (Signed)
Patient is in office today for a nurse visit for  Evenity . Patient was given shots in both sides of lower abdomen. Pt tolerated well.   Pt is getting a red, raised rash which is itchy on upper torso and some lower abd during the last 2 shots, last one was the worse it lasted about 2 weeks. She has not changed any detergents or lotions or anything. It is toleratable.

## 2023-10-02 ENCOUNTER — Encounter: Payer: Self-pay | Admitting: Family Medicine

## 2023-10-18 ENCOUNTER — Ambulatory Visit: Payer: Managed Care, Other (non HMO)

## 2023-10-18 DIAGNOSIS — M810 Age-related osteoporosis without current pathological fracture: Secondary | ICD-10-CM

## 2023-10-18 MED ORDER — ROMOSOZUMAB-AQQG 105 MG/1.17ML ~~LOC~~ SOSY
210.0000 mg | PREFILLED_SYRINGE | SUBCUTANEOUS | Status: AC
Start: 2023-11-17 — End: 2023-11-15
  Administered 2023-11-15: 210 mg via SUBCUTANEOUS

## 2023-10-18 NOTE — Progress Notes (Signed)
 Patient is in office today for a nurse visit for  EVENITY injection . Patient injection was given in the abdominal tissue. Patient tolerated injection well.

## 2023-11-04 ENCOUNTER — Encounter: Payer: Self-pay | Admitting: Family Medicine

## 2023-11-06 ENCOUNTER — Telehealth: Payer: Self-pay

## 2023-11-06 ENCOUNTER — Encounter: Payer: Self-pay | Admitting: Family Medicine

## 2023-11-06 NOTE — Telephone Encounter (Signed)
 Evenity VOB initiated via [email or web address]  Last Evenity inj: 10/18/23 Next Evenity inj DUE: 11/18/23

## 2023-11-11 ENCOUNTER — Encounter: Payer: Self-pay | Admitting: Family Medicine

## 2023-11-11 ENCOUNTER — Other Ambulatory Visit (HOSPITAL_COMMUNITY): Payer: Self-pay

## 2023-11-12 ENCOUNTER — Telehealth: Payer: Self-pay | Admitting: Family Medicine

## 2023-11-12 ENCOUNTER — Telehealth: Payer: Self-pay

## 2023-11-12 NOTE — Telephone Encounter (Signed)
 Copied from CRM [id number]. Topic: Clinical - Prescription Issue >> Nov 12, 2023  3:59 PM Stacy Wright wrote: Reason for CRM:  Patient states that she needs her medication prior auth shipped today because she said she will be at the office Friday morning to get the shot. I did see a message in patients chart asking if the prior authorization can be rushed, please alert the patient on the turn around time for approval.

## 2023-11-12 NOTE — Telephone Encounter (Signed)
 Not a patient at this location.    Copied from CRM [id number]. Topic: Clinical - Prescription Issue >> Nov 12, 2023  3:14 PM Florestine Avers wrote: Reason for CRM: Patient states that she needs her medication prior auth shipped today because she said she will be at the office Friday morning to get the shot. I did see a message in patients chart asking if the prior authorization can be rushed, please alert the patient on the turn around time for approval.

## 2023-11-13 ENCOUNTER — Other Ambulatory Visit (HOSPITAL_COMMUNITY): Payer: Self-pay

## 2023-11-13 ENCOUNTER — Telehealth: Payer: Self-pay

## 2023-11-13 ENCOUNTER — Other Ambulatory Visit: Payer: Self-pay

## 2023-11-13 DIAGNOSIS — M810 Age-related osteoporosis without current pathological fracture: Secondary | ICD-10-CM

## 2023-11-13 DIAGNOSIS — E559 Vitamin D deficiency, unspecified: Secondary | ICD-10-CM

## 2023-11-13 NOTE — Telephone Encounter (Unsigned)
 Copied from CRM [id number]. Topic: Clinical - Prescription Issue >> Nov 13, 2023  9:00 AM Fuller Mandril wrote: Reason for CRM: Spoke with Jeanie Cooks from [facility] [place]. States medication Romosozumab-aqqg (EVENITY) 105 MG/1. injection 210 mg is approved. She spoke with [facility] and they said if they receive the Rx today they can have it to the office by Friday. They just need Rx to be sent to [facility]. Thank You

## 2023-11-13 NOTE — Telephone Encounter (Signed)
 No PA is required. Please send in prescription for drug to be sent to the office. Patient's copay for pharmacy benefit is $100.

## 2023-11-13 NOTE — Telephone Encounter (Signed)
$  100 Copay with pharmacy benefit

## 2023-11-13 NOTE — Telephone Encounter (Signed)
 Stacy Wright with [employer] called back after Jeanie Cooks did this morning to make sure the Fezolinetant (VEOZAH) 45 MG TABS and especially the Romosozumab-aqqg (EVENITY) 105 MG/1. SOSY injection were called into [facility] as [facility] because the patient has an appt on Friday. Please asst further

## 2023-11-13 NOTE — Telephone Encounter (Signed)
 Auto refill already placed for 4/13 LS

## 2023-11-14 ENCOUNTER — Other Ambulatory Visit: Payer: Self-pay

## 2023-11-14 ENCOUNTER — Other Ambulatory Visit (HOSPITAL_COMMUNITY): Payer: Self-pay

## 2023-11-14 ENCOUNTER — Other Ambulatory Visit: Payer: Self-pay | Admitting: Pharmacy Technician

## 2023-11-14 ENCOUNTER — Other Ambulatory Visit: Payer: Self-pay | Admitting: Family Medicine

## 2023-11-14 DIAGNOSIS — E559 Vitamin D deficiency, unspecified: Secondary | ICD-10-CM

## 2023-11-14 DIAGNOSIS — N951 Menopausal and female climacteric states: Secondary | ICD-10-CM

## 2023-11-14 DIAGNOSIS — M810 Age-related osteoporosis without current pathological fracture: Secondary | ICD-10-CM

## 2023-11-14 MED ORDER — EVENITY 105 MG/1.17ML ~~LOC~~ SOSY
210.0000 mg | PREFILLED_SYRINGE | SUBCUTANEOUS | 11 refills | Status: DC
Start: 2023-11-14 — End: 2023-11-14

## 2023-11-14 MED ORDER — VEOZAH 45 MG PO TABS
1.0000 | ORAL_TABLET | Freq: Every day | ORAL | 3 refills | Status: DC
  Filled 2023-11-14 – 2024-01-03 (×2): qty 90, 90d supply, fill #0

## 2023-11-14 MED ORDER — EVENITY 105 MG/1.17ML ~~LOC~~ SOSY
210.0000 mg | PREFILLED_SYRINGE | SUBCUTANEOUS | 11 refills | Status: DC
Start: 2023-11-14 — End: 2024-04-28
  Filled 2023-11-14: qty 2.34, 28d supply, fill #0
  Filled 2023-11-14: qty 3.51, 28d supply, fill #0
  Filled 2023-11-28: qty 2.34, 28d supply, fill #1
  Filled 2024-01-03: qty 2.34, 28d supply, fill #2
  Filled 2024-01-30 – 2024-02-10 (×3): qty 2.34, 28d supply, fill #3
  Filled 2024-02-12 – 2024-03-11 (×2): qty 2.34, 28d supply, fill #4
  Filled 2024-04-03 (×2): qty 2.34, 28d supply, fill #5

## 2023-11-14 NOTE — Progress Notes (Addendum)
 Specialty Pharmacy Initial Fill Coordination Note  Stacy Wright is a 58 y.o. female contacted today regarding initial fill of specialty medication(s) Romosozumab-aqqg Secretary/administrator)   Patient requested Courier to Provider Office   Delivery date: 11/15/23   Verified address: [facility]   Medication will be filled on 4/10.   Patient is aware of $25 copayment.   Patient has appointment tomorrow but will reschedule if needed since provider sent in prescription today. It may not get to office on time.

## 2023-11-15 ENCOUNTER — Other Ambulatory Visit: Payer: Self-pay

## 2023-11-15 ENCOUNTER — Ambulatory Visit (INDEPENDENT_AMBULATORY_CARE_PROVIDER_SITE_OTHER): Payer: Managed Care, Other (non HMO)

## 2023-11-15 ENCOUNTER — Other Ambulatory Visit (HOSPITAL_COMMUNITY): Payer: Self-pay

## 2023-11-15 DIAGNOSIS — M810 Age-related osteoporosis without current pathological fracture: Secondary | ICD-10-CM

## 2023-11-15 NOTE — Telephone Encounter (Signed)
 Patients medication Evenity has arrived, patient aware and will be at appointment today.

## 2023-11-15 NOTE — Progress Notes (Signed)
 Patient is in office today for a nurse visit for  EVENITY injection . Patient injection was given in abdominal tissue. Patient tolerated injection well.

## 2023-11-21 ENCOUNTER — Telehealth: Payer: Self-pay

## 2023-11-21 NOTE — Telephone Encounter (Signed)
 Pharmacy Patient Advocate Encounter   Received notification from Onbase that prior authorization for Veozah 45mg  talets is required/requested.   Insurance verification completed.   The patient is insured through [employer] .   Per test claim: PA required; PA submitted to above mentioned insurance via Fax Key/confirmation #/EOC -- Status is pending

## 2023-11-26 NOTE — Telephone Encounter (Signed)
 Insurance required additional information, filled out and faxed back to [phone]

## 2023-11-27 ENCOUNTER — Other Ambulatory Visit: Payer: Self-pay

## 2023-11-28 ENCOUNTER — Other Ambulatory Visit: Payer: Self-pay

## 2023-11-28 ENCOUNTER — Other Ambulatory Visit: Payer: Self-pay | Admitting: Pharmacy Technician

## 2023-11-28 NOTE — Progress Notes (Signed)
 Specialty Pharmacy Refill Coordination Note  Stacy Wright is a 58 y.o. female contacted today regarding refills of specialty medication(s) Romosozumab -aqqg (Evenity )   Patient requested (Patient-Rptd) Delivery   Delivery date: (Patient-Rptd) 12/11/23   [address] [place] ([address]Rptd[address] [place] [address]   Medication will be filled on 12/10/23.

## 2023-12-05 ENCOUNTER — Other Ambulatory Visit: Payer: Self-pay

## 2023-12-05 ENCOUNTER — Other Ambulatory Visit (HOSPITAL_COMMUNITY): Payer: Self-pay

## 2023-12-10 ENCOUNTER — Other Ambulatory Visit: Payer: Self-pay

## 2023-12-13 ENCOUNTER — Ambulatory Visit: Payer: Managed Care, Other (non HMO)

## 2023-12-13 DIAGNOSIS — M810 Age-related osteoporosis without current pathological fracture: Secondary | ICD-10-CM | POA: Diagnosis not present

## 2023-12-13 MED ORDER — ROMOSOZUMAB-AQQG 105 MG/1.17ML ~~LOC~~ SOSY
210.0000 mg | PREFILLED_SYRINGE | SUBCUTANEOUS | Status: AC
  Administered 2023-12-13: 210 mg via SUBCUTANEOUS

## 2023-12-13 NOTE — Progress Notes (Signed)
 Patient is in office today for a nurse visit for Evenity  injection. Patient injection was given bilaterally in the abdominal tissue. Patient tolerated injection well.

## 2024-01-01 ENCOUNTER — Encounter: Payer: Self-pay | Admitting: Family Medicine

## 2024-01-03 ENCOUNTER — Other Ambulatory Visit: Payer: Self-pay | Admitting: Pharmacy Technician

## 2024-01-03 ENCOUNTER — Encounter: Payer: Self-pay | Admitting: Family Medicine

## 2024-01-03 ENCOUNTER — Other Ambulatory Visit: Payer: Self-pay

## 2024-01-03 ENCOUNTER — Other Ambulatory Visit (HOSPITAL_COMMUNITY): Payer: Self-pay

## 2024-01-03 ENCOUNTER — Other Ambulatory Visit: Payer: Self-pay | Admitting: Family Medicine

## 2024-01-03 DIAGNOSIS — N951 Menopausal and female climacteric states: Secondary | ICD-10-CM

## 2024-01-03 MED ORDER — VEOZAH 45 MG PO TABS: 1.0000 | ORAL_TABLET | Freq: Every day | ORAL | 3 refills | Status: AC

## 2024-01-03 NOTE — Progress Notes (Signed)
 Specialty Pharmacy Refill Coordination Note  Stacy Wright is a 58 y.o. female contacted today regarding refills of specialty medication(s) Romosozumab -aqqg (Evenity )   Patient requested Delivery   Delivery date: 01/07/24   [address] [place] [place] [address]   Medication will be filled on 01/06/24.

## 2024-01-04 ENCOUNTER — Other Ambulatory Visit (HOSPITAL_COMMUNITY): Payer: Self-pay

## 2024-01-06 ENCOUNTER — Other Ambulatory Visit: Payer: Self-pay | Admitting: Family Medicine

## 2024-01-06 ENCOUNTER — Other Ambulatory Visit (HOSPITAL_COMMUNITY): Payer: Self-pay

## 2024-01-06 ENCOUNTER — Other Ambulatory Visit: Payer: Self-pay

## 2024-01-06 DIAGNOSIS — N951 Menopausal and female climacteric states: Secondary | ICD-10-CM

## 2024-01-06 NOTE — Progress Notes (Signed)
 Rx for Evenity  requires Prior Auth. And  also has another rejection Cost Exceeds Maximum. Encounter routed to Tiffany P.

## 2024-01-06 NOTE — Progress Notes (Signed)
 PA key sent to provider

## 2024-01-08 ENCOUNTER — Other Ambulatory Visit: Payer: Self-pay

## 2024-01-10 ENCOUNTER — Other Ambulatory Visit (HOSPITAL_COMMUNITY): Payer: Self-pay

## 2024-01-10 ENCOUNTER — Telehealth: Payer: Self-pay

## 2024-01-10 ENCOUNTER — Ambulatory Visit: Payer: Managed Care, Other (non HMO)

## 2024-01-10 ENCOUNTER — Encounter: Payer: Self-pay | Admitting: Family Medicine

## 2024-01-10 ENCOUNTER — Encounter (HOSPITAL_COMMUNITY): Payer: Self-pay

## 2024-01-10 ENCOUNTER — Other Ambulatory Visit: Payer: Self-pay

## 2024-01-10 NOTE — Telephone Encounter (Signed)
 Pt came into office today and was unable to get injection. I see were PA was previously submitted. Pt would like clarification on what's going on?

## 2024-01-13 ENCOUNTER — Encounter: Payer: Self-pay | Admitting: Family Medicine

## 2024-01-13 ENCOUNTER — Telehealth: Payer: Self-pay

## 2024-01-13 ENCOUNTER — Other Ambulatory Visit (HOSPITAL_COMMUNITY): Payer: Self-pay

## 2024-01-13 ENCOUNTER — Other Ambulatory Visit: Payer: Self-pay

## 2024-01-13 NOTE — Telephone Encounter (Signed)
 Noted

## 2024-01-13 NOTE — Telephone Encounter (Signed)
 Copied from CRM [id number]. Topic: Clinical - Medication Question >> Jan 13, 2024  4:00 PM Antwanette L wrote: Reason for CRM: Patient evenity  shot will arrive on 6/11 to the office. The patient would like for someone to message on her MyChart when the the shot arrives.

## 2024-01-13 NOTE — Progress Notes (Signed)
 Prior Siegfried Dress has been approved. Called & spoke with patient Fill 6/10 & Courier 6/11.

## 2024-01-14 ENCOUNTER — Telehealth: Payer: Self-pay

## 2024-01-14 ENCOUNTER — Other Ambulatory Visit (HOSPITAL_COMMUNITY): Payer: Self-pay

## 2024-01-14 ENCOUNTER — Other Ambulatory Visit: Payer: Self-pay

## 2024-01-14 NOTE — Telephone Encounter (Signed)
 Pharmacy Patient Advocate Encounter     Per test claim: Refill too soon. PA is not needed at this time. Medication was filled 01/14/24. Next eligible fill date is 02/02/24.

## 2024-01-16 ENCOUNTER — Ambulatory Visit (INDEPENDENT_AMBULATORY_CARE_PROVIDER_SITE_OTHER)

## 2024-01-16 DIAGNOSIS — M810 Age-related osteoporosis without current pathological fracture: Secondary | ICD-10-CM

## 2024-01-16 MED ORDER — ROMOSOZUMAB-AQQG 105 MG/1.17ML ~~LOC~~ SOSY
210.0000 mg | PREFILLED_SYRINGE | SUBCUTANEOUS | Status: AC
  Administered 2024-01-16: 210 mg via SUBCUTANEOUS

## 2024-01-16 MED ORDER — ROMOSOZUMAB-AQQG 105 MG/1.17ML ~~LOC~~ SOSY: 210.0000 mg | PREFILLED_SYRINGE | SUBCUTANEOUS | Status: DC

## 2024-01-16 NOTE — Progress Notes (Addendum)
 Patient is in office today for a nurse visit for Evenity  injection. Patient injection was given in the abdominal tissue. Patient tolerated injection well.

## 2024-01-20 ENCOUNTER — Encounter: Payer: Self-pay | Admitting: Family Medicine

## 2024-01-20 NOTE — Telephone Encounter (Signed)
 Patient came in 01/16/2024 for injection

## 2024-01-29 ENCOUNTER — Other Ambulatory Visit (HOSPITAL_COMMUNITY): Payer: Self-pay

## 2024-01-30 ENCOUNTER — Other Ambulatory Visit: Payer: Self-pay

## 2024-02-03 ENCOUNTER — Telehealth: Payer: Self-pay

## 2024-02-03 ENCOUNTER — Other Ambulatory Visit (HOSPITAL_COMMUNITY): Payer: Self-pay

## 2024-02-03 ENCOUNTER — Other Ambulatory Visit: Payer: Self-pay

## 2024-02-03 ENCOUNTER — Encounter: Payer: Self-pay | Admitting: Family Medicine

## 2024-02-03 DIAGNOSIS — B001 Herpesviral vesicular dermatitis: Secondary | ICD-10-CM

## 2024-02-03 MED ORDER — VALACYCLOVIR HCL 500 MG PO TABS: 500.0000 mg | ORAL_TABLET | Freq: Every day | ORAL | 0 refills | Status: DC

## 2024-02-03 NOTE — Telephone Encounter (Signed)
 SABRA

## 2024-02-03 NOTE — Progress Notes (Signed)
 Sent to [employer] for [facility]

## 2024-02-03 NOTE — Telephone Encounter (Signed)
 PA submitted via [email or web address]. EOC: [id number]

## 2024-02-03 NOTE — Telephone Encounter (Signed)
 Evenity  VOB initiated via [email or web address]  Last Evenity  inj: 01/16/24 Next Evenity  inj DUE: 02/15/24

## 2024-02-03 NOTE — Telephone Encounter (Signed)
 Pharmacy Patient Advocate Encounter   Received notification from CoverMyMeds that prior authorization for Veozah  45 is required/requested.   Insurance verification completed.   The patient is insured through [employer] .   Per test claim: PA required; PA submitted to above mentioned insurance via Prompt PA Key/confirmation #/EOC [id number] Status is pending

## 2024-02-04 ENCOUNTER — Other Ambulatory Visit (HOSPITAL_COMMUNITY): Payer: Self-pay

## 2024-02-04 NOTE — Telephone Encounter (Signed)
 BUY AND BILL APPROVAL

## 2024-02-04 NOTE — Telephone Encounter (Signed)
 Pharmacy Patient Advocate Encounter   Received notification from Amgen Portal that prior authorization for Evenity  is required/requested.   Insurance verification completed.   The patient is insured through [place]) .   Per test claim: PA required; PA submitted to above mentioned insurance via Phone Key/confirmation #/EOC [id number] Status is pending   Phone# [phone] Fax# [phone]

## 2024-02-05 ENCOUNTER — Other Ambulatory Visit (HOSPITAL_COMMUNITY): Payer: Self-pay

## 2024-02-05 NOTE — Telephone Encounter (Signed)
 Placed a call to [employer] to check the status of a PA for Evenity .   Unable to process thru the website and the insurance will fax over a PA form to be filled out.   [employer] Phone# [phone]  [employer] Phone# [phone]

## 2024-02-06 ENCOUNTER — Other Ambulatory Visit: Payer: Self-pay

## 2024-02-06 ENCOUNTER — Ambulatory Visit: Payer: Managed Care, Other (non HMO)

## 2024-02-06 NOTE — Telephone Encounter (Signed)
 PA form for pharmacy benefit completed and faxed back to [phone].

## 2024-02-10 ENCOUNTER — Other Ambulatory Visit (HOSPITAL_COMMUNITY): Payer: Self-pay

## 2024-02-10 ENCOUNTER — Other Ambulatory Visit: Payer: Self-pay

## 2024-02-11 ENCOUNTER — Other Ambulatory Visit: Payer: Self-pay

## 2024-02-11 ENCOUNTER — Other Ambulatory Visit (HOSPITAL_COMMUNITY): Payer: Self-pay

## 2024-02-11 ENCOUNTER — Telehealth: Payer: Self-pay

## 2024-02-11 NOTE — Telephone Encounter (Signed)
 Pt ready for scheduling for EVENITY  on or after : 02/15/24  Option# 1 Buy/Bill (Office supplied medication)  Out-of-pocket cost due at time of  office visit: $70  Number of injection/visits approved: ---  Primary: [employer] Evenity  co-insurance: $70 Admin fee co-insurance: 0%  Secondary: --- Evenity  co-insurance:  Admin fee co-insurance:   Medical Benefit Details: Date Benefits were checked: 02/03/24 Deductible: NO/ Coinsurance: $70/ Admin Fee: 0%  Prior Auth: APPROVED PA# [id number] Expiration Date: 02/04/24-02/02/25   # of doses approved: ---    This summary of benefits is an estimation of the patient's out-of-pocket cost. Exact cost may very based on individual plan coverage.

## 2024-02-12 ENCOUNTER — Other Ambulatory Visit: Payer: Self-pay

## 2024-02-12 ENCOUNTER — Encounter: Payer: Self-pay | Admitting: Family Medicine

## 2024-02-12 NOTE — Telephone Encounter (Signed)
 Please review and advise.  I have not done anything with Evenity  and do not know what needs to be done. Patient has been getting through the pharmacy but this looks like she needs to do buy and bill through the office.

## 2024-02-12 NOTE — Telephone Encounter (Signed)
 error

## 2024-02-13 ENCOUNTER — Other Ambulatory Visit: Payer: Self-pay

## 2024-02-13 ENCOUNTER — Other Ambulatory Visit (HOSPITAL_COMMUNITY): Payer: Self-pay

## 2024-02-13 NOTE — Telephone Encounter (Addendum)
 Option# 2- Med Obtained from pharmacy  Pharmacy benefit: Copay $25 (Paid to pharmacy) Admin Fee: 0% (Pay at clinic)   Filled and delivered

## 2024-02-13 NOTE — Telephone Encounter (Signed)
 Medication was sent by pharmacy - patient aware

## 2024-02-13 NOTE — Telephone Encounter (Signed)
 Hey! Evenity  is showing it was filled at [facility] Wouldn't this mean the patient is paying for and bringing in? So not buy & bill as stated in the notes? Just want to make sure since this one is very expensive How much is it at the pharmacy level for the patient?  Thanks for your help! Daeshaun Specht

## 2024-02-13 NOTE — Progress Notes (Signed)
 Specialty Pharmacy Refill Coordination Note  Stacy Wright is a 58 y.o. female contacted today regarding refills of specialty medication(s) Romosozumab -aqqg (Evenity )   Patient requested Courier to Provider Office   Delivery date: 02/13/24   Verified address: [facility]  [place] [address]   Medication will be filled on 02/12/24 once PA is approved.

## 2024-02-13 NOTE — Telephone Encounter (Signed)
 When I tried to do a test claim, it came up prior auth required. I just tried to do another test claim and now its refill too soon. Per [facility], Evenity  has been filled and delivered, so patient is getting it through pharmacy benefit.

## 2024-02-13 NOTE — Telephone Encounter (Signed)
 Okay! We were confused bc this is buy/bill:  Option# 1 Buy/Bill (Office supplied medication)   Out-of-pocket cost due at time of  office visit: $70   Number of injection/visits approved: ---   Primary: [employer] Evenity  co-insurance: $70 Admin fee co-insurance: 0%   Secondary: --- Evenity  co-insurance:  Admin fee co-insurance:    Medical Benefit Details: Date Benefits were checked: 02/03/24 Deductible: NO/ Coinsurance: $70/ Admin Fee: 0%   Prior Auth: APPROVED PA# [id number] Expiration Date: 02/04/24-02/02/25   # of doses approved: ---    I didn't know which would be cheaper, but assume she is okay with it! Thanks!

## 2024-02-13 NOTE — Telephone Encounter (Signed)
 Evenity  approved for pharmacy benefit 02/11/24-04/06/24     Approval letter in media

## 2024-02-14 ENCOUNTER — Telehealth: Payer: Self-pay | Admitting: Family Medicine

## 2024-02-14 ENCOUNTER — Telehealth: Payer: Self-pay

## 2024-02-14 ENCOUNTER — Ambulatory Visit: Admitting: *Deleted

## 2024-02-14 DIAGNOSIS — M810 Age-related osteoporosis without current pathological fracture: Secondary | ICD-10-CM

## 2024-02-14 MED ORDER — ROMOSOZUMAB-AQQG 105 MG/1.17ML ~~LOC~~ SOSY
210.0000 mg | PREFILLED_SYRINGE | Freq: Once | SUBCUTANEOUS | Status: AC
  Administered 2024-03-16 (×2): 210 mg via SUBCUTANEOUS

## 2024-02-14 NOTE — Telephone Encounter (Signed)
 Copied from CRM [id number]. Topic: Appointments - Scheduling Inquiry for Clinic >> Feb 14, 2024 11:54 AM Antwanette L wrote: Reason for CRM: Patient wants to schedule a bone density appt for 8/21 at 8:30 AM. Please contact the patient at [phone]

## 2024-02-14 NOTE — Telephone Encounter (Signed)
 Pt ready for scheduling for EVENITY  on or after : 03/13/24   Option# 1 Buy/Bill (Office supplied medication)   Out-of-pocket cost due at time of  office visit: $70   Number of injection/visits approved: ---   Primary: [employer] Evenity  co-insurance: $70 Admin fee co-insurance: 0%   Secondary: --- Evenity  co-insurance:  Admin fee co-insurance:    Medical Benefit Details: Date Benefits were checked: 02/03/24 Deductible: NO/ Coinsurance: $70/ Admin Fee: 0%   Prior Auth: APPROVED PA# [id number] Expiration Date: 02/04/24-02/02/25   # of doses approved: --- ------------------------------------------------------------------------- Option# 2- Med Obtained from pharmacy   Pharmacy benefit: Copay $25 (Paid to pharmacy) Admin Fee: 0% (Pay at clinic)   Prior Auth: APPROVED PA# [id number] Expiration Date: 02/11/24-04/06/24  # of doses approved: 3  If patient wants fill through the pharmacy benefit please send prescription to: [facility], and include estimated need by date in rx notes. Pharmacy will ship medication directly to the office.  Patient IS eligible for Evenity  Copay Card. Copay Card can make patient's cost as little as $25. Link to apply: [email or web address]   This summary of benefits is an estimation of the patient's out-of-pocket cost. Exact cost may very based on individual plan coverage.

## 2024-02-14 NOTE — Progress Notes (Signed)
 Patient is in office today for a nurse visit for Evenity  injection. Patient abdomen.

## 2024-02-18 ENCOUNTER — Telehealth: Payer: Self-pay

## 2024-02-18 ENCOUNTER — Other Ambulatory Visit (HOSPITAL_COMMUNITY): Payer: Self-pay

## 2024-02-18 DIAGNOSIS — M810 Age-related osteoporosis without current pathological fracture: Secondary | ICD-10-CM

## 2024-02-18 MED ORDER — DENOSUMAB 60 MG/ML ~~LOC~~ SOSY
60.0000 mg | PREFILLED_SYRINGE | SUBCUTANEOUS | Status: AC
Start: 2024-04-06 — End: ?
  Administered 2024-04-17: 60 mg via SUBCUTANEOUS

## 2024-02-18 NOTE — Telephone Encounter (Signed)
 New start for Prolia   - sent for benefit verification

## 2024-02-19 ENCOUNTER — Telehealth: Payer: Self-pay

## 2024-02-19 ENCOUNTER — Other Ambulatory Visit (HOSPITAL_COMMUNITY): Payer: Self-pay

## 2024-02-19 NOTE — Telephone Encounter (Signed)
 Stacy Wright

## 2024-02-19 NOTE — Telephone Encounter (Signed)
 Prolia VOB initiated via [email or web address]  Next Prolia inj DUE: NEW START

## 2024-02-19 NOTE — Telephone Encounter (Addendum)
 BUY AND BILL PA SUBMITTED VIA [email or web address]. EOC: [id number]     PHARMACY PA SUBMITTED VIA [email or web address]. EOC: [id number]

## 2024-02-21 ENCOUNTER — Other Ambulatory Visit (HOSPITAL_COMMUNITY): Payer: Self-pay

## 2024-02-24 ENCOUNTER — Other Ambulatory Visit (HOSPITAL_COMMUNITY): Payer: Self-pay

## 2024-02-24 NOTE — Telephone Encounter (Addendum)
 Pt ready for scheduling for PROLIA  on or after : 04/05/24   Option# 2- Med Obtained from pharmacy:  Pharmacy benefit: Copay $300 (Paid to pharmacy) Admin Fee: 0% (Pay at clinic)  Prior Auth: APPROVED PA# [id number] Expiration Date: 04/05/24-04/05/26  # of doses approved:   If patient wants fill through the pharmacy benefit please send prescription to: [facility], and include estimated need by date in rx notes. Pharmacy will ship medication directly to the office.  Patient IS eligible for Prolia  Copay Card. Copay Card can make patient's cost as little as $25. Link to apply: [email or web address]://[email or web address].[email or web address]  ** This summary of benefits is an estimation of the patient's out-of-pocket cost. Exact cost may very based on individual plan coverage.

## 2024-02-24 NOTE — Telephone Encounter (Signed)
    PHARMACY BENEFIT: $300

## 2024-02-27 NOTE — Telephone Encounter (Signed)
 Appts made

## 2024-03-06 ENCOUNTER — Ambulatory Visit: Payer: Managed Care, Other (non HMO)

## 2024-03-10 ENCOUNTER — Encounter: Payer: Self-pay | Admitting: Family Medicine

## 2024-03-11 ENCOUNTER — Other Ambulatory Visit: Payer: Self-pay

## 2024-03-12 ENCOUNTER — Other Ambulatory Visit (HOSPITAL_COMMUNITY): Payer: Self-pay

## 2024-03-12 ENCOUNTER — Other Ambulatory Visit: Payer: Self-pay

## 2024-03-12 ENCOUNTER — Telehealth: Payer: Self-pay

## 2024-03-12 NOTE — Telephone Encounter (Signed)
 Pharmacy Patient Advocate Encounter   Received notification from Pt Calls Messages that prior authorization for EVENITY  is required/requested.   Insurance verification completed.   The patient is insured through [employer] .   Per test claim: PA required; PA submitted to above mentioned insurance via Prompt PA Key/confirmation #/EOC [id number] Status is pending

## 2024-03-13 ENCOUNTER — Other Ambulatory Visit (HOSPITAL_COMMUNITY): Payer: Self-pay

## 2024-03-13 ENCOUNTER — Other Ambulatory Visit: Payer: Self-pay

## 2024-03-13 NOTE — Telephone Encounter (Signed)
 PA for Prolia  has been cancelled. Will resubmit PA after patient has received Evenity  injection.

## 2024-03-13 NOTE — Telephone Encounter (Deleted)
 Pt ready for scheduling for EVENITY  on or after : 03/16/24  Option# 2- Med Obtained from pharmacy:  Pharmacy benefit: Copay $100 (Paid to pharmacy) Admin Fee: 0% (Pay at clinic)  Prior Auth: APPROVED PA# [id number]  Expiration Date: 03/13/24-04/13/24   # of doses approved: 1   If patient wants fill through the pharmacy benefit please send prescription to: [facility], and include estimated need by date in rx notes. Pharmacy will ship medication directly to the office.  Patient IS eligible for Prolia  Copay Card. Copay Card can make patient's cost as little as $25. Link to apply: [email or web address]://[email or web address]  ** This summary of benefits is an estimation of the patient's out-of-pocket cost. Exact cost may very based on individual plan coverage.

## 2024-03-16 ENCOUNTER — Ambulatory Visit (INDEPENDENT_AMBULATORY_CARE_PROVIDER_SITE_OTHER)

## 2024-03-16 DIAGNOSIS — M810 Age-related osteoporosis without current pathological fracture: Secondary | ICD-10-CM

## 2024-03-16 NOTE — Progress Notes (Signed)
 Noted

## 2024-03-16 NOTE — Progress Notes (Signed)
 Patient is in office today for a nurse visit for EVENITY  injection. Patient injections were given bilaterally in the abdominal tissue. Patient tolerated injections well.

## 2024-03-17 ENCOUNTER — Telehealth: Payer: Self-pay

## 2024-03-17 ENCOUNTER — Other Ambulatory Visit (HOSPITAL_COMMUNITY): Payer: Self-pay

## 2024-03-17 NOTE — Telephone Encounter (Addendum)
 PA for PROLIA  was previously cancelled. PA resubmitted to [employer] through [employer]. EOC# [id number]

## 2024-03-23 ENCOUNTER — Other Ambulatory Visit (HOSPITAL_COMMUNITY): Payer: Self-pay

## 2024-03-23 ENCOUNTER — Telehealth: Payer: Self-pay | Admitting: Family Medicine

## 2024-03-23 ENCOUNTER — Encounter: Payer: Self-pay | Admitting: Family Medicine

## 2024-03-23 DIAGNOSIS — Z0000 Encounter for general adult medical examination without abnormal findings: Secondary | ICD-10-CM

## 2024-03-23 DIAGNOSIS — E559 Vitamin D deficiency, unspecified: Secondary | ICD-10-CM

## 2024-03-23 DIAGNOSIS — Z13220 Encounter for screening for lipoid disorders: Secondary | ICD-10-CM

## 2024-03-23 NOTE — Telephone Encounter (Signed)
 Please add lab orders. Pt has appt Wednesday

## 2024-03-23 NOTE — Telephone Encounter (Signed)
 Lab orders entered as requested

## 2024-03-24 ENCOUNTER — Other Ambulatory Visit (HOSPITAL_COMMUNITY): Payer: Self-pay

## 2024-03-24 NOTE — Telephone Encounter (Signed)
 PA cancelled. Called [employer] at [phone]. Per representative, Prolia  PA is effective 04/05/24-04/05/26.

## 2024-03-25 ENCOUNTER — Other Ambulatory Visit (INDEPENDENT_AMBULATORY_CARE_PROVIDER_SITE_OTHER)

## 2024-03-25 ENCOUNTER — Other Ambulatory Visit

## 2024-03-25 ENCOUNTER — Other Ambulatory Visit: Payer: Self-pay | Admitting: Family Medicine

## 2024-03-25 DIAGNOSIS — E559 Vitamin D deficiency, unspecified: Secondary | ICD-10-CM

## 2024-03-25 DIAGNOSIS — Z780 Asymptomatic menopausal state: Secondary | ICD-10-CM

## 2024-03-25 DIAGNOSIS — Z13220 Encounter for screening for lipoid disorders: Secondary | ICD-10-CM

## 2024-03-25 DIAGNOSIS — Z0000 Encounter for general adult medical examination without abnormal findings: Secondary | ICD-10-CM

## 2024-03-25 LAB — LIPID PANEL

## 2024-03-26 ENCOUNTER — Other Ambulatory Visit: Payer: Self-pay | Admitting: Family Medicine

## 2024-03-26 ENCOUNTER — Encounter: Payer: Self-pay | Admitting: Family Medicine

## 2024-03-26 DIAGNOSIS — D649 Anemia, unspecified: Secondary | ICD-10-CM

## 2024-03-26 LAB — CMP14+EGFR
ALT: 22 IU/L (ref 0–32)
AST: 19 IU/L (ref 0–40)
Albumin: 4.2 g/dL (ref 3.8–4.9)
Alkaline Phosphatase: 77 IU/L (ref 44–121)
BUN/Creatinine Ratio: 18 (ref 9–23)
BUN: 12 mg/dL (ref 6–24)
Bilirubin Total: 0.4 mg/dL (ref 0.0–1.2)
CO2: 24 mmol/L (ref 20–29)
Calcium: 9.4 mg/dL (ref 8.7–10.2)
Chloride: 106 mmol/L (ref 96–106)
Creatinine, Ser: 0.68 mg/dL (ref 0.57–1.00)
Globulin, Total: 1.9 g/dL (ref 1.5–4.5)
Glucose: 92 mg/dL (ref 70–99)
Potassium: 4.5 mmol/L (ref 3.5–5.2)
Sodium: 142 mmol/L (ref 134–144)
Total Protein: 6.1 g/dL (ref 6.0–8.5)
eGFR: 102 mL/min/1.73 (ref >59)

## 2024-03-26 LAB — VITAMIN D 25 HYDROXY (VIT D DEFICIENCY, FRACTURES): Vit D, 25-Hydroxy: 65.6 ng/mL (ref 30.0–100.0)

## 2024-03-26 LAB — LIPID PANEL
Cholesterol, Total: 155 mg/dL (ref 100–199)
HDL: 86 mg/dL (ref >39)
LDL CALC COMMENT:: 1.8 ratio (ref 0.0–4.4)
LDL Chol Calc (NIH): 60 mg/dL (ref 0–99)
Triglycerides: 35 mg/dL (ref 0–149)
VLDL Cholesterol Cal: 9 mg/dL (ref 5–40)

## 2024-03-26 LAB — CBC WITH DIFFERENTIAL/PLATELET
Basophils Absolute: 0 x10E3/uL (ref 0.0–0.2)
Basos: 1 %
EOS (ABSOLUTE): 0.1 x10E3/uL (ref 0.0–0.4)
Eos: 3 %
Hematocrit: 34.3 % (ref 34.0–46.6)
Hemoglobin: 10.8 g/dL — ABNORMAL LOW (ref 11.1–15.9)
Immature Grans (Abs): 0 x10E3/uL (ref 0.0–0.1)
Immature Granulocytes: 0 %
Lymphocytes Absolute: 1 x10E3/uL (ref 0.7–3.1)
Lymphs: 27 %
MCH: 31 pg (ref 26.6–33.0)
MCHC: 31.5 g/dL (ref 31.5–35.7)
MCV: 99 fL — ABNORMAL HIGH (ref 79–97)
Monocytes Absolute: 0.3 x10E3/uL (ref 0.1–0.9)
Monocytes: 9 %
Neutrophils Absolute: 2.2 x10E3/uL (ref 1.4–7.0)
Neutrophils: 60 %
Platelets: 132 x10E3/uL — ABNORMAL LOW (ref 150–450)
RBC: 3.48 x10E6/uL — ABNORMAL LOW (ref 3.77–5.28)
RDW: 12.6 % (ref 11.7–15.4)
WBC: 3.6 x10E3/uL (ref 3.4–10.8)

## 2024-03-26 NOTE — Progress Notes (Signed)
 Orders Placed This Encounter  Procedures   Fecal occult blood, imunochemical    Standing Status:   Future    Expiration Date:   07/26/2024   Anemia Profile B    Standing Status:   Future    Expiration Date:   03/26/2025   Results for orders placed or performed in visit on 03/25/24 (from the past 72 hours)  VITAMIN D  25 Hydroxy (Vit-D Deficiency, Fractures)     Status: None   Collection Time: 03/25/24  8:38 AM  Result Value Ref Range   Vit D, 25-Hydroxy 65.6 30.0 - 100.0 ng/mL    Comment: Vitamin D  deficiency has been defined by the Institute of Medicine and an Endocrine Society practice guideline as a level of serum 25-OH vitamin D  less than 20 ng/mL (1,2). The Endocrine Society went on to further define vitamin D  insufficiency as a level between 21 and 29 ng/mL (2). 1. IOM (Institute of Medicine). 2010. Dietary reference    intakes for calcium and D. [place]: The    [employer]. 2. Holick MF, Binkley Fairbanks, Bischoff-Ferrari HA, et al.    Evaluation, treatment, and prevention of vitamin D     deficiency: an Endocrine Society clinical practice    guideline. JCEM. 2011 Jul; 96(7):1911-30.   Lipid panel     Status: None   Collection Time: 03/25/24  8:38 AM  Result Value Ref Range   Cholesterol, Total 155 100 - 199 mg/dL   Triglycerides 35 0 - 149 mg/dL   HDL 86 >60 mg/dL   VLDL Cholesterol Cal 9 5 - 40 mg/dL   LDL Chol Calc (NIH) 60 0 - 99 mg/dL   Chol/HDL Ratio 1.8 0.0 - 4.4 ratio    Comment:                                   T. Chol/HDL Ratio                                             Men  Women                               1/2 Avg.Risk  3.4    3.3                                   Avg.Risk  5.0    4.4                                2X Avg.Risk  9.6    7.1                                3X Avg.Risk 23.4   11.0   CMP14+EGFR     Status: None   Collection Time: 03/25/24  8:38 AM  Result Value Ref Range   Glucose 92 70 - 99 mg/dL   BUN 12 6 - 24 mg/dL    Creatinine, Ser 9.31 0.57 - 1.00 mg/dL   eGFR 897 >40 fO/fpw/8.26   BUN/Creatinine Ratio 18 9 - 23  Sodium 142 134 - 144 mmol/L   Potassium 4.5 3.5 - 5.2 mmol/L   Chloride 106 96 - 106 mmol/L   CO2 24 20 - 29 mmol/L   Calcium 9.4 8.7 - 10.2 mg/dL   Total Protein 6.1 6.0 - 8.5 g/dL   Albumin 4.2 3.8 - 4.9 g/dL   Globulin, Total 1.9 1.5 - 4.5 g/dL   Bilirubin Total 0.4 0.0 - 1.2 mg/dL   Alkaline Phosphatase 77 44 - 121 IU/L   AST 19 0 - 40 IU/L   ALT 22 0 - 32 IU/L  CBC with Differential/Platelet     Status: Abnormal   Collection Time: 03/25/24  8:38 AM  Result Value Ref Range   WBC 3.6 3.4 - 10.8 x10E3/uL   RBC 3.48 (L) 3.77 - 5.28 x10E6/uL   Hemoglobin 10.8 (L) 11.1 - 15.9 g/dL   Hematocrit 65.6 65.9 - 46.6 %   MCV 99 (H) 79 - 97 fL   MCH 31.0 26.6 - 33.0 pg   MCHC 31.5 31.5 - 35.7 g/dL   RDW 87.3 88.2 - 84.5 %   Platelets 132 (L) 150 - 450 x10E3/uL   Neutrophils 60 Not Estab. %   Lymphs 27 Not Estab. %   Monocytes 9 Not Estab. %   Eos 3 Not Estab. %   Basos 1 Not Estab. %   Neutrophils Absolute 2.2 1.4 - 7.0 x10E3/uL   Lymphocytes Absolute 1.0 0.7 - 3.1 x10E3/uL   Monocytes Absolute 0.3 0.1 - 0.9 x10E3/uL   EOS (ABSOLUTE) 0.1 0.0 - 0.4 x10E3/uL   Basophils Absolute 0.0 0.0 - 0.2 x10E3/uL   Immature Granulocytes 0 Not Estab. %   Immature Grans (Abs) 0.0 0.0 - 0.1 x10E3/uL

## 2024-03-31 ENCOUNTER — Ambulatory Visit: Payer: Self-pay | Admitting: Family Medicine

## 2024-03-31 NOTE — Telephone Encounter (Signed)
 Please review and verify that patient doe not have an buy and bill option through the office, that she must get medication through the pharmacy benefits

## 2024-04-01 ENCOUNTER — Other Ambulatory Visit

## 2024-04-01 DIAGNOSIS — D649 Anemia, unspecified: Secondary | ICD-10-CM

## 2024-04-01 NOTE — Telephone Encounter (Signed)
 Patient has a $2000 deductible to meet for buy and bill. Since patient has Nurse, learning disability, she is eligible for the Prolia  Copay card through Amgen. Link to apply: [email or web address]

## 2024-04-01 NOTE — Telephone Encounter (Signed)
 Patient notified

## 2024-04-02 ENCOUNTER — Encounter: Payer: Self-pay | Admitting: Family Medicine

## 2024-04-02 ENCOUNTER — Other Ambulatory Visit: Payer: Self-pay

## 2024-04-02 ENCOUNTER — Other Ambulatory Visit: Payer: Self-pay | Admitting: Pharmacy Technician

## 2024-04-02 ENCOUNTER — Ambulatory Visit: Payer: Self-pay | Admitting: Family Medicine

## 2024-04-02 DIAGNOSIS — D696 Thrombocytopenia, unspecified: Secondary | ICD-10-CM

## 2024-04-02 DIAGNOSIS — M810 Age-related osteoporosis without current pathological fracture: Secondary | ICD-10-CM

## 2024-04-02 LAB — ANEMIA PROFILE B
Basophils Absolute: 0.1 x10E3/uL (ref 0.0–0.2)
Basos: 1 %
EOS (ABSOLUTE): 0.1 x10E3/uL (ref 0.0–0.4)
Eos: 2 %
Ferritin: 21 ng/mL (ref 15–150)
Folate: 18.3 ng/mL (ref >3.0)
Hematocrit: 36.6 % (ref 34.0–46.6)
Hemoglobin: 11.7 g/dL (ref 11.1–15.9)
Immature Grans (Abs): 0 x10E3/uL (ref 0.0–0.1)
Immature Granulocytes: 0 %
Iron Saturation: 23 (ref 15–55)
Iron: 82 ug/dL (ref 27–159)
Lymphocytes Absolute: 1.4 x10E3/uL (ref 0.7–3.1)
Lymphs: 32 %
MCH: 31.3 pg (ref 26.6–33.0)
MCHC: 32 g/dL (ref 31.5–35.7)
MCV: 98 fL — ABNORMAL HIGH (ref 79–97)
Monocytes Absolute: 0.4 x10E3/uL (ref 0.1–0.9)
Monocytes: 9 %
Neutrophils Absolute: 2.5 x10E3/uL (ref 1.4–7.0)
Neutrophils: 56 %
Platelets: 146 x10E3/uL — ABNORMAL LOW (ref 150–450)
RBC: 3.74 x10E6/uL — ABNORMAL LOW (ref 3.77–5.28)
RDW: 12.3 % (ref 11.7–15.4)
Retic Ct Pct: 1.1 % (ref 0.6–2.6)
Total Iron Binding Capacity: 358 ug/dL (ref 250–450)
UIBC: 276 ug/dL (ref 131–425)
Vitamin B-12: 396 pg/mL (ref 232–1245)
WBC: 4.6 x10E3/uL (ref 3.4–10.8)

## 2024-04-02 MED ORDER — DENOSUMAB 60 MG/ML ~~LOC~~ SOSY
60.0000 mg | PREFILLED_SYRINGE | Freq: Once | SUBCUTANEOUS | 0 refills | Status: AC
  Filled 2024-04-02: qty 1, 1d supply, fill #0
  Filled 2024-04-03: qty 1, 30d supply, fill #0
  Filled 2024-04-08: qty 1, 180d supply, fill #0

## 2024-04-03 ENCOUNTER — Telehealth: Payer: Self-pay | Admitting: Family Medicine

## 2024-04-03 ENCOUNTER — Other Ambulatory Visit: Payer: Self-pay

## 2024-04-03 NOTE — Telephone Encounter (Signed)
 Copied from CRM [id number]. Topic: General - Other >> Apr 03, 2024 11:38 AM Wess RAMAN wrote: Reason for CRM: Patient is missing test requisition form from home cologuard test. She would like to know if it can be emailed to her.  Callback #: [phone]

## 2024-04-03 NOTE — Progress Notes (Signed)
 Medication was couriered to [facility] on 8/8. Refill flowsheet not completed in error.

## 2024-04-07 ENCOUNTER — Encounter: Payer: Self-pay | Admitting: Family Medicine

## 2024-04-07 ENCOUNTER — Other Ambulatory Visit: Payer: Self-pay

## 2024-04-07 NOTE — Progress Notes (Signed)
 Pharmacy Patient Advocate Encounter  Insurance verification completed.   The patient is insured through [employer]   Ran test claim for Prolia . Co-pay is $300.  This test claim was processed through [facility]- copay amounts may vary at other pharmacies due to pharmacy/plan contracts, or as the patient moves through the different stages of their insurance plan.

## 2024-04-08 ENCOUNTER — Other Ambulatory Visit: Payer: Self-pay

## 2024-04-08 NOTE — Progress Notes (Signed)
 Specialty Pharmacy Initial Fill Coordination Note  Stacy Wright is a 58 y.o. female contacted today regarding initial fill of specialty medication(s) Denosumab  (PROLIA )   Patient requested Courier to Provider Office   Delivery date: 04/14/24   Verified address: [facility]   Medication will be filled on 9/8.   Patient is aware of $0 copayment.

## 2024-04-09 ENCOUNTER — Other Ambulatory Visit

## 2024-04-09 DIAGNOSIS — D649 Anemia, unspecified: Secondary | ICD-10-CM

## 2024-04-10 LAB — FECAL OCCULT BLOOD, IMMUNOCHEMICAL: Fecal Occult Bld: NEGATIVE

## 2024-04-13 ENCOUNTER — Other Ambulatory Visit: Payer: Self-pay

## 2024-04-14 ENCOUNTER — Other Ambulatory Visit

## 2024-04-15 ENCOUNTER — Encounter: Payer: Self-pay | Admitting: Family Medicine

## 2024-04-15 NOTE — Telephone Encounter (Signed)
 This has already been addressed in another message (dated on 04/07/24). Will close this one out.

## 2024-04-17 ENCOUNTER — Ambulatory Visit (INDEPENDENT_AMBULATORY_CARE_PROVIDER_SITE_OTHER)

## 2024-04-17 DIAGNOSIS — M810 Age-related osteoporosis without current pathological fracture: Secondary | ICD-10-CM | POA: Diagnosis not present

## 2024-04-17 NOTE — Progress Notes (Signed)
 Patient is in office today for a nurse visit for  Prolia injection . Patient Injection was given in the  Left arm. Patient tolerated injection well.

## 2024-04-28 NOTE — Addendum Note (Signed)
 Addended by: BRANDY SETTER D on: 04/28/2024 09:33 AM   Modules accepted: Orders

## 2024-05-14 ENCOUNTER — Other Ambulatory Visit

## 2024-05-26 ENCOUNTER — Other Ambulatory Visit

## 2024-05-26 DIAGNOSIS — D696 Thrombocytopenia, unspecified: Secondary | ICD-10-CM

## 2024-05-26 LAB — CBC WITH DIFFERENTIAL/PLATELET
Basophils Absolute: 0 x10E3/uL (ref 0.0–0.2)
Basos: 1 %
EOS (ABSOLUTE): 0.1 x10E3/uL (ref 0.0–0.4)
Eos: 3 %
Hematocrit: 39.3 % (ref 34.0–46.6)
Hemoglobin: 12.7 g/dL (ref 11.1–15.9)
Immature Grans (Abs): 0 x10E3/uL (ref 0.0–0.1)
Immature Granulocytes: 0 %
Lymphocytes Absolute: 1.3 x10E3/uL (ref 0.7–3.1)
Lymphs: 30 %
MCH: 31.1 pg (ref 26.6–33.0)
MCHC: 32.3 g/dL (ref 31.5–35.7)
MCV: 96 fL (ref 79–97)
Monocytes Absolute: 0.3 x10E3/uL (ref 0.1–0.9)
Monocytes: 8 %
Neutrophils Absolute: 2.4 x10E3/uL (ref 1.4–7.0)
Neutrophils: 58 %
Platelets: 131 x10E3/uL — ABNORMAL LOW (ref 150–450)
RBC: 4.08 x10E6/uL (ref 3.77–5.28)
RDW: 12 % (ref 11.7–15.4)
WBC: 4.2 x10E3/uL (ref 3.4–10.8)

## 2024-05-27 ENCOUNTER — Ambulatory Visit: Payer: Self-pay | Admitting: Family Medicine

## 2024-05-27 ENCOUNTER — Other Ambulatory Visit: Payer: Self-pay | Admitting: Family Medicine

## 2024-05-27 DIAGNOSIS — D696 Thrombocytopenia, unspecified: Secondary | ICD-10-CM

## 2024-05-27 NOTE — Progress Notes (Signed)
 Orders Placed This Encounter  Procedures   Ambulatory referral to Hematology / Oncology    Referral Priority:   Routine    Referral Type:   Consultation    Referral Reason:   Specialty Services Required    Requested Specialty:   Oncology    Number of Visits Requested:   1

## 2024-06-07 ENCOUNTER — Other Ambulatory Visit: Payer: Self-pay | Admitting: *Deleted

## 2024-06-07 DIAGNOSIS — B001 Herpesviral vesicular dermatitis: Secondary | ICD-10-CM

## 2024-06-08 NOTE — Telephone Encounter (Signed)
 [facility] NT[facility] last OV 03/29/23 NO RF sent to pharmacy last OV greater than a year

## 2024-06-08 NOTE — Telephone Encounter (Signed)
 Apt scheduled for 06/29/2024

## 2024-06-29 ENCOUNTER — Inpatient Hospital Stay

## 2024-06-29 ENCOUNTER — Encounter: Payer: Self-pay | Admitting: Family Medicine

## 2024-06-29 ENCOUNTER — Encounter: Payer: Self-pay | Admitting: Oncology

## 2024-06-29 ENCOUNTER — Ambulatory Visit (INDEPENDENT_AMBULATORY_CARE_PROVIDER_SITE_OTHER): Admitting: Family Medicine

## 2024-06-29 ENCOUNTER — Inpatient Hospital Stay: Payer: Self-pay | Attending: Oncology | Admitting: Oncology

## 2024-06-29 VITALS — BP 97/55 | HR 53 | Temp 97.4°F | Ht 65.0 in | Wt 115.4 lb

## 2024-06-29 VITALS — BP 126/80 | HR 63 | Temp 98.6°F | Resp 16 | Wt 117.2 lb

## 2024-06-29 DIAGNOSIS — Z0001 Encounter for general adult medical examination with abnormal findings: Secondary | ICD-10-CM | POA: Diagnosis not present

## 2024-06-29 DIAGNOSIS — Z79624 Long term (current) use of inhibitors of nucleotide synthesis: Secondary | ICD-10-CM | POA: Insufficient documentation

## 2024-06-29 DIAGNOSIS — Z23 Encounter for immunization: Secondary | ICD-10-CM

## 2024-06-29 DIAGNOSIS — D696 Thrombocytopenia, unspecified: Secondary | ICD-10-CM | POA: Insufficient documentation

## 2024-06-29 DIAGNOSIS — E559 Vitamin D deficiency, unspecified: Secondary | ICD-10-CM

## 2024-06-29 DIAGNOSIS — Z87891 Personal history of nicotine dependence: Secondary | ICD-10-CM | POA: Diagnosis not present

## 2024-06-29 DIAGNOSIS — B001 Herpesviral vesicular dermatitis: Secondary | ICD-10-CM

## 2024-06-29 DIAGNOSIS — Z8349 Family history of other endocrine, nutritional and metabolic diseases: Secondary | ICD-10-CM | POA: Insufficient documentation

## 2024-06-29 DIAGNOSIS — M810 Age-related osteoporosis without current pathological fracture: Secondary | ICD-10-CM

## 2024-06-29 DIAGNOSIS — Z13220 Encounter for screening for lipoid disorders: Secondary | ICD-10-CM

## 2024-06-29 DIAGNOSIS — Z0000 Encounter for general adult medical examination without abnormal findings: Secondary | ICD-10-CM

## 2024-06-29 LAB — CBC WITH DIFFERENTIAL (CANCER CENTER ONLY)
Abs Immature Granulocytes: 0.01 K/uL (ref 0.00–0.07)
Basophils Absolute: 0 K/uL (ref 0.0–0.1)
Basophils Relative: 1 %
Eosinophils Absolute: 0.1 K/uL (ref 0.0–0.5)
Eosinophils Relative: 2 %
HCT: 38.4 % (ref 36.0–46.0)
Hemoglobin: 12.7 g/dL (ref 12.0–15.0)
Immature Granulocytes: 0 %
Lymphocytes Relative: 29 %
Lymphs Abs: 1.3 K/uL (ref 0.7–4.0)
MCH: 30.6 pg (ref 26.0–34.0)
MCHC: 33.1 g/dL (ref 30.0–36.0)
MCV: 92.5 fL (ref 80.0–100.0)
Monocytes Absolute: 0.4 K/uL (ref 0.1–1.0)
Monocytes Relative: 8 %
Neutro Abs: 2.6 K/uL (ref 1.7–7.7)
Neutrophils Relative %: 60 %
Platelet Count: 130 K/uL — ABNORMAL LOW (ref 150–400)
RBC: 4.15 MIL/uL (ref 3.87–5.11)
RDW: 12.7 % (ref 11.5–15.5)
WBC Count: 4.3 K/uL (ref 4.0–10.5)
nRBC: 0 % (ref 0.0–0.2)

## 2024-06-29 LAB — PROTIME-INR
INR: 1 (ref 0.8–1.2)
Prothrombin Time: 13.4 s (ref 11.4–15.2)

## 2024-06-29 LAB — TSH: TSH: 1.57 u[IU]/mL (ref 0.350–4.500)

## 2024-06-29 LAB — IMMATURE PLATELET FRACTION: Immature Platelet Fraction: 11.1 % — ABNORMAL HIGH (ref 1.2–8.6)

## 2024-06-29 LAB — FIBRINOGEN: Fibrinogen: 364 mg/dL (ref 210–475)

## 2024-06-29 LAB — LACTATE DEHYDROGENASE: LDH: 209 U/L (ref 105–235)

## 2024-06-29 LAB — APTT: aPTT: 27 s (ref 24–36)

## 2024-06-29 MED ORDER — VALACYCLOVIR HCL 500 MG PO TABS: 500.0000 mg | ORAL_TABLET | Freq: Every day | ORAL | 4 refills | Status: AC

## 2024-06-29 NOTE — Progress Notes (Signed)
 Stacy Wright is a 58 y.o. female presents to office today for annual physical exam examination.    She reports that she has been doing well.  She had her appointment with oncology/hematology today and they are doing quite a workup.  She does report that she has been easily bleeding when she gets cut and it just takes her longer to scab up but otherwise she has no concerns.  Denies any vaginal bleeding, epistaxis.  No reports of unplanned weight loss, night sweats.  Veozah  is being taken at half a tablet per day and this seems to be helping.  She is compliant with Valtrex  for cold sores and reports that she has not had any flareups lately. Doing well on Prolia .  Reports no bony pain.  Marital status: married, Substance use: none There are no preventive care reminders to display for this patient.  Immunization History  Administered Date(s) Administered   Hepb-cpg 06/29/2024   Influenza,inj,Quad PF,6+ Mos 05/03/2017, 05/04/2020   Influenza-Unspecified 05/16/2018, 05/03/2024   Moderna Sars-Covid-2 Vaccination 08/12/2019, 09/09/2019   Tdap 02/17/2019   Zoster Recombinant(Shingrix) 08/29/2021, 12/05/2021   Past Medical History:  Diagnosis Date   GERD (gastroesophageal reflux disease)    H/O varicella    Headache(784.0)    Frequently   History of measles    Migraine, menstrual 02/14/07   Psychosocial stressors 11/23/10   Yeast infection    Social History   Socioeconomic History   Marital status: Widowed    Spouse name: Not on file   Number of children: Not on file   Years of education: Not on file   Highest education level: Not on file  Occupational History   Not on file  Tobacco Use   Smoking status: Former    Current packs/day: 0.00    Types: Cigarettes    Quit date: 10/27/2013    Years since quitting: 10.6   Smokeless tobacco: Never  Vaping Use   Vaping status: Never Used  Substance and Sexual Activity   Alcohol use: No   Drug use: No   Sexual activity: Yes    Birth  control/protection: Surgical    Comment: BTL  Other Topics Concern   Not on file  Social History Narrative   Not on file   Social Drivers of Health   Financial Resource Strain: Not on file  Food Insecurity: No Food Insecurity (06/29/2024)   Hunger Vital Sign    Worried About Running Out of Food in the Last Year: Never true    Ran Out of Food in the Last Year: Never true  Transportation Needs: No Transportation Needs (06/29/2024)   PRAPARE - Administrator, Civil Service (Medical): No    Lack of Transportation (Non-Medical): No  Physical Activity: Not on file  Stress: Not on file  Social Connections: Not on file  Intimate Partner Violence: Not At Risk (06/29/2024)   Humiliation, Afraid, Rape, and Kick questionnaire    Fear of Current or Ex-Partner: No    Emotionally Abused: No    Physically Abused: No    Sexually Abused: No   Past Surgical History:  Procedure Laterality Date   DILATION AND CURETTAGE OF UTERUS     TUBAL LIGATION     History reviewed. No pertinent family history.  Current Outpatient Medications:    Ascorbic Acid (VITAMIN C) 100 MG tablet, Take 100 mg by mouth daily., Disp: , Rfl:    cetirizine (ZYRTEC) 10 MG tablet, Take 10 mg by mouth daily., Disp: ,  Rfl:    cholecalciferol  (VITAMIN D3) 25 MCG (1000 UNIT) tablet, Take 1,000 Units by mouth daily., Disp: , Rfl:    ELDERBERRY PO, Take 1 capsule by mouth daily., Disp: , Rfl:    Fezolinetant  (VEOZAH ) 45 MG TABS, Take 1 tablet (45 mg total) by mouth daily. For hot flashes (Patient taking differently: Take 1 tablet by mouth daily. For hot flashes. Takes 1.2 tab daily otherwise with cause back pain), Disp: 90 tablet, Rfl: 3   ibuprofen  (ADVIL ) 600 MG tablet, Take 1 tablet (600 mg total) by mouth every 8 (eight) hours as needed., Disp: 90 tablet, Rfl: 0   MAGNESIUM OXIDE, ANTACID, PO, Take 1 capsule by mouth daily., Disp: , Rfl:    Multiple Vitamin (MULTIVITAMIN) tablet, Take 1 tablet by mouth daily., Disp:  , Rfl:    Multiple Vitamins-Minerals (HAIR SKIN AND NAILS FORMULA PO), Take by mouth., Disp: , Rfl:    valACYclovir  (VALTREX ) 500 MG tablet, Take 1 tablet (500 mg total) by mouth daily. For chronic suppression, Disp: 90 tablet, Rfl: 0   Vitamin D -Vitamin K (K2 PLUS D3 PO), Take 1 capsule by mouth daily. (Patient not taking: Reported on 06/29/2024), Disp: , Rfl:    vitamin k 100 MCG tablet, Take 100 mcg by mouth daily. (Patient not taking: Reported on 06/29/2024), Disp: , Rfl:   Current Facility-Administered Medications:    denosumab  (PROLIA ) injection 60 mg, 60 mg, Subcutaneous, Q6 months, Alek Poncedeleon M, DO, 60 mg at 04/17/24 9165  Not on File   ROS: Review of Systems Pertinent items noted in HPI and remainder of comprehensive ROS otherwise negative.    Physical exam BP (!) 97/55   Pulse (!) 53   Temp (!) 97.4 F (36.3 C)   Ht 5' 5 (1.651 m)   Wt 115 lb 6 oz (52.3 kg)   LMP 04/01/2012   SpO2 98%   BMI 19.20 kg/m  General appearance: alert, cooperative, appears stated age, and no distress Head: Normocephalic, without obvious abnormality, atraumatic Eyes: negative findings: lids and lashes normal, conjunctivae and sclerae normal, corneas clear, and pupils equal, round, reactive to light and accomodation Ears: normal TM's and external ear canals both ears Nose: Nares normal. Septum midline. Mucosa normal. No drainage or sinus tenderness. Throat: lips, mucosa, and tongue normal; teeth and gums normal Neck: no adenopathy, no carotid bruit, supple, symmetrical, trachea midline, and thyroid  not enlarged, symmetric, no tenderness/mass/nodules Back: symmetric, no curvature. ROM normal. No CVA tenderness. Lungs: clear to auscultation bilaterally Heart: regular rate and rhythm, S1, S2 normal, no murmur, click, rub or gallop Abdomen: soft, non-tender; bowel sounds normal; no masses,  no organomegaly Extremities: extremities normal, atraumatic, no cyanosis or edema Pulses: 2+ and  symmetric Skin: Skin color, texture, turgor normal. No rashes or lesions Lymph nodes: No supraclavicular or anterior cervical lymphadenopathy Neurologic: Alert and oriented X 3, normal strength and tone. Normal symmetric reflexes. Normal coordination and gait      06/29/2024   10:42 AM 06/29/2024   10:36 AM 08/29/2021    2:15 PM  Depression screen PHQ 2/9  Decreased Interest 0 0 0  Down, Depressed, Hopeless 0 0 0  PHQ - 2 Score 0 0 0      08/29/2021    2:15 PM  GAD 7 : Generalized Anxiety Score  Nervous, Anxious, on Edge 0  Control/stop worrying 0  Worry too much - different things 0  Trouble relaxing 0  Restless 0  Easily annoyed or irritable 0  Afraid -  awful might happen 0  Total GAD 7 Score 0  Anxiety Difficulty Not difficult at all    Recent Results (from the past 2160 hours)  Anemia Profile B     Status: Abnormal   Collection Time: 04/01/24  8:37 AM  Result Value Ref Range   Total Iron Binding Capacity 358 250 - 450 ug/dL   UIBC 723 868 - 574 ug/dL   Iron 82 27 - 840 ug/dL   Iron Saturation 23 15 - 55 %   Ferritin 21 15 - 150 ng/mL   Vitamin B-12 396 232 - 1,245 pg/mL   Folate 18.3 >3.0 ng/mL    Comment: A serum folate concentration of less than 3.1 ng/mL is considered to represent clinical deficiency.    WBC 4.6 3.4 - 10.8 x10E3/uL   RBC 3.74 (L) 3.77 - 5.28 x10E6/uL   Hemoglobin 11.7 11.1 - 15.9 g/dL   Hematocrit 63.3 65.9 - 46.6 %   MCV 98 (H) 79 - 97 fL   MCH 31.3 26.6 - 33.0 pg   MCHC 32.0 31.5 - 35.7 g/dL   RDW 87.6 88.2 - 84.5 %   Platelets 146 (L) 150 - 450 x10E3/uL   Neutrophils 56 Not Estab. %   Lymphs 32 Not Estab. %   Monocytes 9 Not Estab. %   Eos 2 Not Estab. %   Basos 1 Not Estab. %   Neutrophils Absolute 2.5 1.4 - 7.0 x10E3/uL   Lymphocytes Absolute 1.4 0.7 - 3.1 x10E3/uL   Monocytes Absolute 0.4 0.1 - 0.9 x10E3/uL   EOS (ABSOLUTE) 0.1 0.0 - 0.4 x10E3/uL   Basophils Absolute 0.1 0.0 - 0.2 x10E3/uL   Immature Granulocytes 0 Not Estab.  %   Immature Grans (Abs) 0.0 0.0 - 0.1 x10E3/uL   Retic Ct Pct 1.1 0.6 - 2.6 %  Fecal occult blood, imunochemical     Status: None   Collection Time: 04/09/24  9:18 AM   Specimen: Stool   ST  Result Value Ref Range   Fecal Occult Bld Negative Negative  CBC with Differential/Platelet     Status: Abnormal   Collection Time: 05/26/24  8:35 AM  Result Value Ref Range   WBC 4.2 3.4 - 10.8 x10E3/uL   RBC 4.08 3.77 - 5.28 x10E6/uL   Hemoglobin 12.7 11.1 - 15.9 g/dL   Hematocrit 60.6 65.9 - 46.6 %   MCV 96 79 - 97 fL   MCH 31.1 26.6 - 33.0 pg   MCHC 32.3 31.5 - 35.7 g/dL   RDW 87.9 88.2 - 84.5 %   Platelets 131 (L) 150 - 450 x10E3/uL   Neutrophils 58 Not Estab. %   Lymphs 30 Not Estab. %   Monocytes 8 Not Estab. %   Eos 3 Not Estab. %   Basos 1 Not Estab. %   Neutrophils Absolute 2.4 1.4 - 7.0 x10E3/uL   Lymphocytes Absolute 1.3 0.7 - 3.1 x10E3/uL   Monocytes Absolute 0.3 0.1 - 0.9 x10E3/uL   EOS (ABSOLUTE) 0.1 0.0 - 0.4 x10E3/uL   Basophils Absolute 0.0 0.0 - 0.2 x10E3/uL   Immature Granulocytes 0 Not Estab. %   Immature Grans (Abs) 0.0 0.0 - 0.1 x10E3/uL  Lactate dehydrogenase     Status: None   Collection Time: 06/29/24 11:25 AM  Result Value Ref Range   LDH 209 105 - 235 U/L    Comment: Please note change in reference range. Performed at [facility], [address], [place], [place] [address]   Fibrinogen   Status: None   Collection Time: 06/29/24 11:25 AM  Result Value Ref Range   Fibrinogen  364 210 - 475 mg/dL    Comment: (NOTE) Fibrinogen  results may be underestimated in patients receiving thrombolytic therapy. Performed at [facility], [address], [place], [place] [address]   APTT     Status: None   Collection Time: 06/29/24 11:25 AM  Result Value Ref Range   aPTT 27 24 - 36 seconds    Comment: Performed at [facility], [address], [place], [place] [address]  Protime-INR     Status: None   Collection  Time: 06/29/24 11:25 AM  Result Value Ref Range   Prothrombin Time 13.4 11.4 - 15.2 seconds   INR 1.0 0.8 - 1.2    Comment: (NOTE) INR goal varies based on device and disease states. Performed at [facility], [address], [place], [place] [address]   Immature Platelet Fraction     Status: Abnormal   Collection Time: 06/29/24 11:25 AM  Result Value Ref Range   Immature Platelet Fraction 11.1 (H) 1.2 - 8.6 %    Comment:        An elevated IPF indicates increased platelet production. A low platelet count with an elevated IPF may be associated with peripheral platelet destruction (e.g. DIC, ITP) or bone marrow recovery (e.g. after chemotherapy or transplant). A low platelet count with a low or non- elevated IPF is consistent with a platelet production disorder. Performed at [facility], [address] [place], [place] [address]   CBC with Differential (Cancer Center Only)     Status: Abnormal   Collection Time: 06/29/24 11:25 AM  Result Value Ref Range   WBC Count 4.3 4.0 - 10.5 K/uL    Comment: REPEATED TO VERIFY WHITE COUNT CONFIRMED ON SMEAR    RBC 4.15 3.87 - 5.11 MIL/uL   Hemoglobin 12.7 12.0 - 15.0 g/dL   HCT 61.5 63.9 - 53.9 %   MCV 92.5 80.0 - 100.0 fL   MCH 30.6 26.0 - 34.0 pg   MCHC 33.1 30.0 - 36.0 g/dL   RDW 87.2 88.4 - 84.4 %   Platelet Count 130 (L) 150 - 400 K/uL    Comment: REPEATED TO VERIFY PLATELET COUNT CONFIRMED BY SMEAR SPECIMEN CHECKED FOR CLOTS    nRBC 0.0 0.0 - 0.2 %   Neutrophils Relative % 60 %   Neutro Abs 2.6 1.7 - 7.7 K/uL   Lymphocytes Relative 29 %   Lymphs Abs 1.3 0.7 - 4.0 K/uL   Monocytes Relative 8 %   Monocytes Absolute 0.4 0.1 - 1.0 K/uL   Eosinophils Relative 2 %   Eosinophils Absolute 0.1 0.0 - 0.5 K/uL   Basophils Relative 1 %   Basophils Absolute 0.0 0.0 - 0.1 K/uL   Immature Granulocytes 0 %   Abs Immature Granulocytes 0.01 0.00 - 0.07 K/uL    Comment: Performed at [facility], [address], [place], [place] [address]  TSH     Status: None   Collection Time: 06/29/24 11:26 AM  Result Value Ref Range   TSH 1.570 0.350 - 4.500 uIU/mL    Comment: Performed at [facility], [address], [place], [place] [address]     Assessment/ Plan: Dorthea Bihari here for annual physical exam.   Annual physical exam  Age-related osteoporosis without current pathological fracture - Plan: CMP14+EGFR, VITAMIN D  25 Hydroxy (Vit-D Deficiency, Fractures), TSH  Vitamin D  deficiency -  Plan: VITAMIN D  25 Hydroxy (Vit-D Deficiency, Fractures)  Low platelet count - Plan: CBC with Differential  Screening, lipid - Plan: Lipid Panel  Herpes labialis - Plan: valACYclovir  (VALTREX ) 500 MG tablet  Immunization due - Plan: Heplisav-B  (HepB-CPG) Vaccine   We reviewed her DEXA scan today.  She is up-to-date on labs.  Currently being worked up with hematology for slightly low platelet level which is new for her that was incidentally found on routine labs.  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  First hepatitis B vaccination administered today  Counseled on healthy lifestyle choices, including diet (rich in fruits, vegetables and lean meats and low in salt and simple carbohydrates) and exercise (at least 30 minutes of moderate physical activity daily).  Patient to follow up 1 year  Anaaya Fuster M. Jolinda, DO

## 2024-06-29 NOTE — Assessment & Plan Note (Addendum)
 Labs on 05/26/2024 showed platelet count of 131,000.  White count 4200 with normal differential.  Hemoglobin normal at 12.7.  She was referred to us  for further evaluation of thrombocytopenia.  On review of records, she has had mild thrombocytopenia since August 2025 with platelet count ranging from 131,000-146,000.  Mild thrombocytopenia.  No unusual bleeding or bruising.  No symptoms of fatigue or lethargy.  Differential diagnoses considered included autoimmune thrombocytopenia, bone marrow disorder (less likely), and splenic sequestration. No current indication for intervention as platelet count is above 30,000. No family history of autoimmune conditions.  No symptoms of splenomegaly or liver enlargement.   No recent medication changes or significant alcohol use.   No signs of bleeding disorders such as epistaxis or melena. Reports increased bleeding from minor cuts, but no significant blood loss or anemia symptoms.  Labs today showed stable platelet count of 130,000.  White count and hemoglobin are within normal limits.  TSH, PT, PTT, fibrinogen , LDH are all within normal limits.  Immature platelet fraction is increased at 11.1%, suggesting appropriate bone marrow response to mild thrombocytopenia.  - Ordered ANA test to screen for autoimmune conditions.  I will call her in 1 week to go over all the results and discuss plan of care.  Given mild thrombocytopenia, no intervention would be warranted.  Patient was provided reassurance.  RTC in 4 months for follow-up with repeat labs.

## 2024-06-29 NOTE — Progress Notes (Signed)
 [facility]  HEMATOLOGY CLINIC CONSULTATION NOTE   PATIENT NAME: Stacy Wright   MR#: [id number] DOB: September 21, 1965  DATE OF SERVICE: 06/29/2024  REFERRING PHYSICIAN  Jolinda Norene HERO, DO   Patient Care Team: Jolinda Norene HERO, DO as PCP - General (Family Medicine)   REASON FOR CONSULTATION/ CHIEF COMPLAINT:  Evaluation of thrombocytopenia  ASSESSMENT & PLAN:  Stacy Wright is a 58 y.o. lady with a past medical history of osteoporosis, GERD, was referred to our service for evaluation of thrombocytopenia.  Thrombocytopenia Labs on 05/26/2024 showed platelet count of 131,000.  White count 4200 with normal differential.  Hemoglobin normal at 12.7.  She was referred to us  for further evaluation of thrombocytopenia.  On review of records, she has had mild thrombocytopenia since August 2025 with platelet count ranging from 131,000-146,000.  Mild thrombocytopenia.  No unusual bleeding or bruising.  No symptoms of fatigue or lethargy.  Differential diagnoses considered included autoimmune thrombocytopenia, bone marrow disorder (less likely), and splenic sequestration. No current indication for intervention as platelet count is above 30,000. No family history of autoimmune conditions.  No symptoms of splenomegaly or liver enlargement.   No recent medication changes or significant alcohol use.   No signs of bleeding disorders such as epistaxis or melena. Reports increased bleeding from minor cuts, but no significant blood loss or anemia symptoms.  Labs today showed stable platelet count of 130,000.  White count and hemoglobin are within normal limits.  TSH, PT, PTT, fibrinogen , LDH are all within normal limits.  Immature platelet fraction is increased at 11.1%, suggesting appropriate bone marrow response to mild thrombocytopenia.  - Ordered ANA test to screen for autoimmune conditions.  I will call her in 1 week to go over all the results and discuss plan of  care.  Given mild thrombocytopenia, no intervention would be warranted.  Patient was provided reassurance.  RTC in 4 months for follow-up with repeat labs.  Osteoporosis Diagnosed with osteoporosis, currently on Prolia  injections following previous treatment with Reclast.  - Continue Prolia  injections as scheduled.  I reviewed lab results and outside records for this visit and discussed relevant results with the patient. Diagnosis, plan of care and treatment options were also discussed in detail with the patient. Opportunity provided to ask questions and answers provided to her apparent satisfaction. Provided instructions to call our clinic with any problems, questions or concerns prior to return visit. I recommended to continue follow-up with PCP and sub-specialists. She verbalized understanding and agreed with the plan. No barriers to learning was detected.  Chinita Patten, MD 06/29/2024 1:23 PM [facility] - A DEPT OF [facility] [address] [place] [address] Dept: [phone] Dept Fax: [phone]  HISTORY OF PRESENTING ILLNESS:   Discussed the use of AI scribe software for clinical note transcription with the patient, who gave verbal consent to proceed.  History of Present Illness Stacy Wright is a 58 year old female who presents with low platelet count. She was referred by Dr. Jolinda for evaluation of low platelet count.  Labs on 05/26/2024 showed platelet count of 131,000.  White count 4200 with normal differential.  Hemoglobin normal at 12.7.  She was referred to us  for further evaluation of thrombocytopenia.  On review of records, she has had mild thrombocytopenia since August 2025 with platelet count ranging from 131,000-146,000.  No lethargy or fatigue is reported, and she maintains a high energy level.  She  has been taking magnesium oxide but reduced the dosage after realizing she was  taking more than recommended. She donates blood every two to three months. Her blood type is O positive. She takes vitamin K with D3.  She has a history of osteoporosis and is currently on Prolia  injections, having switched from [place] after eleven months. She has noticed increased bleeding from minor cuts, such as a scraped knuckle and a knee cut that bled more than usual.  Her family history includes her father passing away from a brain aneurysm, and her siblings have high blood pressure and high cholesterol. No personal history of lupus, hepatitis, or significant bleeding issues such as epistaxis or gum bleeds.  She maintains an active lifestyle, denies alcohol abuse, and reports no significant joint pain except for occasional knee discomfort, which she attributes to a possible slight injury. No gastrointestinal symptoms such as acid reflux or abdominal pain.  MEDICAL HISTORY Past Medical History:  Diagnosis Date   GERD (gastroesophageal reflux disease)    H/O varicella    Headache(784.0)    Frequently   History of measles    Migraine, menstrual 02/14/07   Psychosocial stressors 11/23/10   Yeast infection      SURGICAL HISTORY Past Surgical History:  Procedure Laterality Date   DILATION AND CURETTAGE OF UTERUS     TUBAL LIGATION       SOCIAL HISTORY: She reports that she quit smoking about 10 years ago. Her smoking use included cigarettes. She has never used smokeless tobacco. She reports that she does not drink alcohol and does not use drugs. Social History   Socioeconomic History   Marital status: Widowed    Spouse name: Not on file   Number of children: Not on file   Years of education: Not on file   Highest education level: Not on file  Occupational History   Not on file  Tobacco Use   Smoking status: Former    Current packs/day: 0.00    Types: Cigarettes    Quit date: 10/27/2013    Years since quitting: 10.6   Smokeless tobacco: Never  Vaping Use   Vaping  status: Never Used  Substance and Sexual Activity   Alcohol use: No   Drug use: No   Sexual activity: Yes    Birth control/protection: Surgical    Comment: BTL  Other Topics Concern   Not on file  Social History Narrative   Not on file   Social Drivers of Health   Financial Resource Strain: Not on file  Food Insecurity: No Food Insecurity (06/29/2024)   Hunger Vital Sign    Worried About Running Out of Food in the Last Year: Never true    Ran Out of Food in the Last Year: Never true  Transportation Needs: No Transportation Needs (06/29/2024)   PRAPARE - Administrator, Civil Service (Medical): No    Lack of Transportation (Non-Medical): No  Physical Activity: Not on file  Stress: Not on file  Social Connections: Not on file  Intimate Partner Violence: Not At Risk (06/29/2024)   Humiliation, Afraid, Rape, and Kick questionnaire    Fear of Current or Ex-Partner: No    Emotionally Abused: No    Physically Abused: No    Sexually Abused: No    FAMILY HISTORY: Her family history is not on file.  CURRENT MEDICATIONS   Current Outpatient Medications  Medication Instructions   cetirizine (ZYRTEC) 10 mg, Daily   cholecalciferol  (VITAMIN D3) 1,000 Units,  Daily   ELDERBERRY PO 1 capsule, Daily   ibuprofen  (ADVIL ) 600 mg, Oral, Every 8 hours PRN   MAGNESIUM OXIDE, ANTACID, PO 1 capsule, Daily   Multiple Vitamin (MULTIVITAMIN) tablet 1 tablet, Daily   Multiple Vitamins-Minerals (HAIR SKIN AND NAILS FORMULA PO) Take by mouth.   valACYclovir  (VALTREX ) 500 mg, Oral, Daily, For chronic suppression   Veozah  45 mg, Oral, Daily, For hot flashes   vitamin C 100 mg, Daily   Vitamin D -Vitamin K (K2 PLUS D3 PO) 1 capsule, Daily   vitamin k 100 mcg, Daily     ALLERGIES  She has no allergies on file.  REVIEW OF SYSTEMS:  Review of Systems - Oncology   Rest of the pertinent review of systems is unremarkable except as mentioned above in HPI.  PHYSICAL EXAMINATION:   Onc  Performance Status - 06/29/24 1036       ECOG Perf Status   ECOG Perf Status Fully active, able to carry on all pre-disease performance without restriction      KPS SCALE   KPS % SCORE Normal, no compliants, no evidence of disease          Vitals:   06/29/24 1022  BP: 126/80  Pulse: 63  Resp: 16  Temp: 98.6 F (37 C)  SpO2: 100%   Filed Weights   06/29/24 1022  Weight: 117 lb 3.2 oz (53.2 kg)    Physical Exam Constitutional:      General: She is not in acute distress.    Appearance: Normal appearance.  HENT:     Head: Normocephalic and atraumatic.  Cardiovascular:     Rate and Rhythm: Normal rate.  Pulmonary:     Effort: Pulmonary effort is normal. No respiratory distress.  Abdominal:     General: There is no distension.  Neurological:     General: No focal deficit present.     Mental Status: She is alert and oriented to person, place, and time.  Psychiatric:        Mood and Affect: Mood normal.        Behavior: Behavior normal.      LABORATORY DATA:   I have reviewed the data as listed.  Results for orders placed or performed in visit on 06/29/24  Lactate dehydrogenase  Result Value Ref Range   LDH 209 105 - 235 U/L  Fibrinogen   Result Value Ref Range   Fibrinogen  364 210 - 475 mg/dL  APTT  Result Value Ref Range   aPTT 27 24 - 36 seconds  Protime-INR  Result Value Ref Range   Prothrombin Time 13.4 11.4 - 15.2 seconds   INR 1.0 0.8 - 1.2  Immature Platelet Fraction  Result Value Ref Range   Immature Platelet Fraction 11.1 (H) 1.2 - 8.6 %  TSH  Result Value Ref Range   TSH 1.570 0.350 - 4.500 uIU/mL  CBC with Differential (Cancer Center Only)  Result Value Ref Range   WBC Count 4.3 4.0 - 10.5 K/uL   RBC 4.15 3.87 - 5.11 MIL/uL   Hemoglobin 12.7 12.0 - 15.0 g/dL   HCT 61.5 63.9 - 53.9 %   MCV 92.5 80.0 - 100.0 fL   MCH 30.6 26.0 - 34.0 pg   MCHC 33.1 30.0 - 36.0 g/dL   RDW 87.2 88.4 - 84.4 %   Platelet Count 130 (L) 150 - 400 K/uL    nRBC 0.0 0.0 - 0.2 %   Neutrophils Relative % 60 %   Neutro Abs 2.6  1.7 - 7.7 K/uL   Lymphocytes Relative 29 %   Lymphs Abs 1.3 0.7 - 4.0 K/uL   Monocytes Relative 8 %   Monocytes Absolute 0.4 0.1 - 1.0 K/uL   Eosinophils Relative 2 %   Eosinophils Absolute 0.1 0.0 - 0.5 K/uL   Basophils Relative 1 %   Basophils Absolute 0.0 0.0 - 0.1 K/uL   Immature Granulocytes 0 %   Abs Immature Granulocytes 0.01 0.00 - 0.07 K/uL     RADIOGRAPHIC STUDIES:  No pertinent imaging studies available to review.  Orders Placed This Encounter  Procedures   CBC with Differential (Cancer Center Only)    Standing Status:   Future    Number of Occurrences:   1    Expiration Date:   06/29/2025   TSH    Standing Status:   Future    Number of Occurrences:   1    Expiration Date:   06/29/2025   Immature Platelet Fraction    Standing Status:   Future    Number of Occurrences:   1    Expiration Date:   06/29/2025   Protime-INR    Standing Status:   Future    Number of Occurrences:   1    Expiration Date:   06/29/2025   APTT    Standing Status:   Future    Number of Occurrences:   1    Expiration Date:   06/29/2025   ANA w/Reflex if Positive    Standing Status:   Future    Number of Occurrences:   1    Expiration Date:   06/29/2025   Fibrinogen     Standing Status:   Future    Number of Occurrences:   1    Expiration Date:   06/29/2025   Lactate dehydrogenase    Standing Status:   Future    Number of Occurrences:   1    Expiration Date:   06/29/2025    Future Appointments  Date Time Provider Department Center  06/29/2024  2:00 PM Jolinda Norene HERO, [place] [facility] [address]  07/06/2024  3:30 PM Adhya Cocco, Ch[place], MD [facility] None  10/16/2024  8:30 AM [facility] CLINICAL SUPPORT [facility] [address]  10/26/2024  3:00 PM [facility] PHLEBOTOMIST [facility] None  10/26/2024  3:30 PM Harvie Morua, Chinita, MD [facility] None    I spent a total of 55 minutes during this encounter with the patient including  review of chart and various tests results, discussions about plan of care and coordination of care plan.  This document was completed utilizing speech recognition software. Grammatical errors, random word insertions, pronoun errors, and incomplete sentences are an occasional consequence of this system due to software limitations, ambient noise, and hardware issues. Any formal questions or concerns about the content, text or information contained within the body of this dictation should be directly addressed to the provider for clarification.

## 2024-06-30 LAB — ANA W/REFLEX IF POSITIVE: Anti Nuclear Antibody (ANA): NEGATIVE

## 2024-07-06 ENCOUNTER — Inpatient Hospital Stay: Payer: Self-pay | Attending: Oncology | Admitting: Oncology

## 2024-07-06 DIAGNOSIS — D696 Thrombocytopenia, unspecified: Secondary | ICD-10-CM | POA: Diagnosis not present

## 2024-07-06 NOTE — Progress Notes (Unsigned)
 [facility]  HEMATOLOGY-ONCOLOGY ELECTRONIC VISIT PROGRESS NOTE  PATIENT NAME: Stacy Wright   MR#: [id number] DOB: [birth year]  DATE OF SERVICE: 07/06/2024  Patient Care Team: Jolinda Norene HERO, DO as PCP - General (Family Medicine)  I connected with the patient via telephone conference and verified that I am speaking with the correct person using two identifiers. The patient's location is at home and I am providing care from the [facility].  I discussed the limitations, risks, security and privacy concerns of performing an evaluation and management service by e-visits and the availability of in person appointments. I also discussed with the patient that there may be a patient responsible charge related to this service. The patient expressed understanding and agreed to proceed.   ASSESSMENT & PLAN:   Stacy Wright is a 58 y.o. lady with a past medical history of osteoporosis, GERD, was referred to our service in November 2025 for evaluation of thrombocytopenia.   No problem-specific Assessment & Plan notes found for this encounter.   Assessment and Plan Assessment & Plan Immune thrombocytopenia (ITP) Platelet count is well-managed at 130,000, with previous counts of 131,000 and 132,000. White and red blood cell counts are normal. Thyroid  function and blood clotting parameters are normal. ANA test is negative, ruling out autoimmune conditions like lupus. Elevated immature platelet fraction suggests platelet destruction post-release from bone marrow, indicating intact bone marrow function. Differential diagnosis includes ITP, TTP, and DIC, but clinical presentation and lab results support ITP. No evidence of leukemia or other serious conditions. No current need for intervention as platelet count is above 30,000. Discussed potential causes such as molecular mimicry post-infection and the role of medications like Evenity  and Prolia , though no direct link established. No signs  of unusual bleeding or purpura. - Continue to monitor platelet count every six months. - Check platelet count during illness or viral infections. - Report any unusual bleeding, purpura, or other symptoms immediately. - Scheduled follow-up appointment in March for routine check-up.      I discussed the assessment and treatment plan with the patient. The patient was provided an opportunity to ask questions and all were answered. The patient agreed with the plan and demonstrated an understanding of the instructions. The patient was advised to call back or seek an in-person evaluation if the symptoms worsen or if the condition fails to improve as anticipated.    I spent 15 minutes over the phone with the patient reviewing test results, discuss management and coordination/planning of care.  Chinita Patten, MD 07/06/2024 1:03 PM [facility] - A DEPT OF [facility] [address] [place] [address] Dept: [phone] Dept Fax: [phone]   INTERVAL HISTORY:  Please see above for problem oriented charting.  The purpose of today's discussion is to explain recent lab results and to formulate plan of care.  Discussed the use of AI scribe software for clinical note transcription with the patient, who gave verbal consent to proceed.  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  She has been experiencing low platelet counts, with recent measurements showing stability at around 130,000. Her platelet count was 131,000 previously and 132,000 in August. No purple discoloration or purpura noted on her skin.  She has undergone a comprehensive workup including normal thyroid  function tests, normal blood clotting parameters, and a negative ANA test.  She  has been receiving Evenity  injections for 11 months to improve bone density,  which has shown a substantial increase. She is also on Prolia  injections every six months for osteoporosis maintenance. She is postmenopausal, active, and works a lot. She has not experienced any major illnesses or viral infections recently.  She has not had any adverse reactions to COVID vaccines except for rashes on her torso, which have since resolved.    SUMMARY OF HEMATOLOGY HISTORY:  She was referred by Dr. Jolinda for evaluation of low platelet count.   Labs on 05/26/2024 showed platelet count of 131,000.  White count 4200 with normal differential.  Hemoglobin normal at 12.7.  She was referred to us  for further evaluation of thrombocytopenia.   On review of records, she has had mild thrombocytopenia since August 2025 with platelet count ranging from 131,000-146,000.   No lethargy or fatigue is reported, and she maintains a high energy level.   She has been taking magnesium oxide but reduced the dosage after realizing she was taking more than recommended. She donates blood every two to three months. Her blood type is O positive. She takes vitamin K with D3.   She has a history of osteoporosis and is currently on Prolia  injections, having switched from [place] after eleven months. She has noticed increased bleeding from minor cuts, such as a scraped knuckle and a knee cut that bled more than usual.   Her family history includes her father passing away from a brain aneurysm, and her siblings have high blood pressure and high cholesterol. No personal history of lupus, hepatitis, or significant bleeding issues such as epistaxis or gum bleeds.   She maintains an active lifestyle, denies alcohol abuse, and reports no significant joint pain except for occasional knee discomfort, which she attributes to a possible slight injury. No gastrointestinal symptoms such as acid reflux or abdominal pain.  Mild thrombocytopenia.  No unusual bleeding or bruising.  No symptoms of fatigue or lethargy.    Differential diagnoses considered included autoimmune thrombocytopenia, bone marrow disorder (less likely), and splenic sequestration. No current indication for intervention as platelet count is above 30,000. No family history of autoimmune conditions.   No symptoms of splenomegaly or liver enlargement.    No recent medication changes or significant alcohol use.    No signs of bleeding disorders such as epistaxis or melena. Reports increased bleeding from minor cuts, but no significant blood loss or anemia symptoms.   On her consultation with us  on 06/29/2024, labs showed stable platelet count of 130,000.  White count and hemoglobin were within normal limits.  TSH, PT, PTT, fibrinogen , LDH were all within normal limits. ANA was negative. Immature platelet fraction is increased at 11.1%, suggesting appropriate bone marrow response to mild thrombocytopenia.  Given mild thrombocytopenia, no intervention would be warranted.  Patient was provided reassurance.   RTC in 4 months for follow-up with repeat labs.  REVIEW OF SYSTEMS:    Review of Systems - Oncology  All other pertinent systems were reviewed with the patient and are negative.  I have reviewed the past medical history, past surgical history, social history and family history with the patient and they are unchanged from previous note.  ALLERGIES:  She has no allergies on file.  MEDICATIONS:  Current Outpatient Medications  Medication Sig Dispense Refill   Ascorbic Acid (VITAMIN C) 100 MG tablet Take 100 mg by mouth daily.     cetirizine (ZYRTEC) 10 MG tablet Take 10 mg by mouth daily.  cholecalciferol  (VITAMIN D3) 25 MCG (1000 UNIT) tablet Take 1,000 Units by mouth daily.     ELDERBERRY PO Take 1 capsule by mouth daily.     Fezolinetant  (VEOZAH ) 45 MG TABS Take 1 tablet (45 mg total) by mouth daily. For hot flashes (Patient taking differently: Take 1 tablet by mouth daily. For hot flashes. Takes 1.2 tab daily otherwise with  cause back pain) 90 tablet 3   ibuprofen  (ADVIL ) 600 MG tablet Take 1 tablet (600 mg total) by mouth every 8 (eight) hours as needed. 90 tablet 0   MAGNESIUM OXIDE, ANTACID, PO Take 1 capsule by mouth daily.     Multiple Vitamin (MULTIVITAMIN) tablet Take 1 tablet by mouth daily.     Multiple Vitamins-Minerals (HAIR SKIN AND NAILS FORMULA PO) Take by mouth.     valACYclovir  (VALTREX ) 500 MG tablet Take 1 tablet (500 mg total) by mouth daily. For chronic suppression 90 tablet 4   Current Facility-Administered Medications  Medication Dose Route Frequency Provider Last Rate Last Admin   denosumab  (PROLIA ) injection 60 mg  60 mg Subcutaneous Q6 months Gottschalk, Ashly M, DO   60 mg at 04/17/24 9165    PHYSICAL EXAMINATION:  Not performed today as it was a phone only visit  LABORATORY DATA:   I have reviewed the data as listed.  Recent Results (from the past 2160 hours)  Fecal occult blood, imunochemical     Status: None   Collection Time: 04/09/24  9:18 AM   Specimen: Stool   ST  Result Value Ref Range   Fecal Occult Bld Negative Negative  CBC with Differential/Platelet     Status: Abnormal   Collection Time: 05/26/24  8:35 AM  Result Value Ref Range   WBC 4.2 3.4 - 10.8 x10E3/uL   RBC 4.08 3.77 - 5.28 x10E6/uL   Hemoglobin 12.7 11.1 - 15.9 g/dL   Hematocrit 60.6 65.9 - 46.6 %   MCV 96 79 - 97 fL   MCH 31.1 26.6 - 33.0 pg   MCHC 32.3 31.5 - 35.7 g/dL   RDW 87.9 88.2 - 84.5 %   Platelets 131 (L) 150 - 450 x10E3/uL   Neutrophils 58 Not Estab. %   Lymphs 30 Not Estab. %   Monocytes 8 Not Estab. %   Eos 3 Not Estab. %   Basos 1 Not Estab. %   Neutrophils Absolute 2.4 1.4 - 7.0 x10E3/uL   Lymphocytes Absolute 1.3 0.7 - 3.1 x10E3/uL   Monocytes Absolute 0.3 0.1 - 0.9 x10E3/uL   EOS (ABSOLUTE) 0.1 0.0 - 0.4 x10E3/uL   Basophils Absolute 0.0 0.0 - 0.2 x10E3/uL   Immature Granulocytes 0 Not Estab. %   Immature Grans (Abs) 0.0 0.0 - 0.1 x10E3/uL  Lactate dehydrogenase      Status: None   Collection Time: 06/29/24 11:25 AM  Result Value Ref Range   LDH 209 105 - 235 U/L    Comment: Please note change in reference range. Performed at [facility], [address], [place], [place] [address]   Fibrinogen      Status: None   Collection Time: 06/29/24 11:25 AM  Result Value Ref Range   Fibrinogen  364 210 - 475 mg/dL    Comment: (NOTE) Fibrinogen  results may be underestimated in patients receiving thrombolytic therapy. Performed at [facility], [address], [place], [place] [address]   ANA w/Reflex if Positive     Status: None   Collection Time: 06/29/24 11:25 AM  Result Value Ref  Range   Anti Nuclear Antibody (ANA) Negative Negative    Comment: (NOTE) Performed At: [facility] [address] [place], [place] [id number] Jennette Shorter MD Ey:[id number]   APTT     Status: None   Collection Time: 06/29/24 11:25 AM  Result Value Ref Range   aPTT 27 24 - 36 seconds    Comment: Performed at [facility], [address], [place], [place] [address]  Protime-INR     Status: None   Collection Time: 06/29/24 11:25 AM  Result Value Ref Range   Prothrombin Time 13.4 11.4 - 15.2 seconds   INR 1.0 0.8 - 1.2    Comment: (NOTE) INR goal varies based on device and disease states. Performed at [facility], [address] [place], [place] [address]   Immature Platelet Fraction     Status: Abnormal   Collection Time: 06/29/24 11:25 AM  Result Value Ref Range   Immature Platelet Fraction 11.1 (H) 1.2 - 8.6 %    Comment:        An elevated IPF indicates increased platelet production. A low platelet count with an elevated IPF may be associated with peripheral platelet destruction (e.g. DIC, ITP) or bone marrow recovery (e.g. after chemotherapy or transplant). A low platelet count with a low or non- elevated IPF is consistent with a platelet production disorder. Performed at [facility], [address] [place], [place] [address]   CBC with Differential (Cancer Center Only)     Status: Abnormal   Collection Time: 06/29/24 11:25 AM  Result Value Ref Range   WBC Count 4.3 4.0 - 10.5 K/uL    Comment: REPEATED TO VERIFY WHITE COUNT CONFIRMED ON SMEAR    RBC 4.15 3.87 - 5.11 MIL/uL   Hemoglobin 12.7 12.0 - 15.0 g/dL   HCT 61.5 63.9 - 53.9 %   MCV 92.5 80.0 - 100.0 fL   MCH 30.6 26.0 - 34.0 pg   MCHC 33.1 30.0 - 36.0 g/dL   RDW 87.2 88.4 - 84.4 %   Platelet Count 130 (L) 150 - 400 K/uL    Comment: REPEATED TO VERIFY PLATELET COUNT CONFIRMED BY SMEAR SPECIMEN CHECKED FOR CLOTS    nRBC 0.0 0.0 - 0.2 %   Neutrophils Relative % 60 %   Neutro Abs 2.6 1.7 - 7.7 K/uL   Lymphocytes Relative 29 %   Lymphs Abs 1.3 0.7 - 4.0 K/uL   Monocytes Relative 8 %   Monocytes Absolute 0.4 0.1 - 1.0 K/uL   Eosinophils Relative 2 %   Eosinophils Absolute 0.1 0.0 - 0.5 K/uL   Basophils Relative 1 %   Basophils Absolute 0.0 0.0 - 0.1 K/uL   Immature Granulocytes 0 %   Abs Immature Granulocytes 0.01 0.00 - 0.07 K/uL    Comment: Performed at [facility], [address], [place], [place] [address]  TSH     Status: None   Collection Time: 06/29/24 11:26 AM  Result Value Ref Range   TSH 1.570 0.350 - 4.500 uIU/mL    Comment: Performed at [facility], [address], [place], [place] [address]     RADIOGRAPHIC STUDIES:  I have personally reviewed the radiological images as listed and agree with the findings in the report.  No results found.  *** No recent pertinent imaging studies available to review.  No orders of the defined types were placed in this encounter.    Future Appointments  Date Time Provider Department Center  07/06/2024  3:30 PM Huntley Knoop, MD [facility] None  07/27/2024  9:30 AM [facility] CLINICAL SUPPORT [facility] [address]  10/16/2024  8:30 AM [facility] CLINICAL SUPPORT [facility] [address]  10/26/2024  3:00 PM [facility] PHLEBOTOMIST [facility] None  10/26/2024  3:30 PM Aalyah Mansouri, Chinita, MD [facility] None  06/29/2025  8:00 AM Jolinda Norene HERO, DO [facility] [address]    This document was completed utilizing engineer, civil (consulting). Grammatical errors, random word insertions, pronoun errors, and incomplete sentences are an occasional consequence of this system due to software limitations, ambient noise, and hardware issues. Any formal questions or concerns about the content, text or information contained within the body of this dictation should be directly addressed to the provider for clarification.

## 2024-07-20 ENCOUNTER — Other Ambulatory Visit (HOSPITAL_COMMUNITY): Payer: Self-pay

## 2024-07-20 ENCOUNTER — Other Ambulatory Visit: Payer: Self-pay

## 2024-07-27 ENCOUNTER — Encounter: Payer: Self-pay | Admitting: Oncology

## 2024-07-27 ENCOUNTER — Ambulatory Visit

## 2024-07-27 NOTE — Assessment & Plan Note (Signed)
 Labs on 05/26/2024 showed platelet count of 131,000.  White count 4200 with normal differential.  Hemoglobin normal at 12.7.  She was referred to us  for further evaluation of thrombocytopenia.  On review of records, she has had mild thrombocytopenia since August 2025 with platelet count ranging from 131,000-146,000.  Mild thrombocytopenia.  No unusual bleeding or bruising.  No symptoms of fatigue or lethargy.  Differential diagnoses considered included autoimmune thrombocytopenia, bone marrow disorder (less likely), and splenic sequestration. No current indication for intervention as platelet count is above 30,000. No family history of autoimmune conditions.  No symptoms of splenomegaly or liver enlargement.   No recent medication changes or significant alcohol use.   No signs of bleeding disorders such as epistaxis or melena. Reports increased bleeding from minor cuts, but no significant blood loss or anemia symptoms.   On her consultation with us  on 06/29/2024, labs showed stable platelet count of 130,000.  White count and hemoglobin were within normal limits.  TSH, PT, PTT, fibrinogen , LDH were all within normal limits. ANA was negative. Immature platelet fraction is increased at 11.1%, suggesting appropriate bone marrow response to mild thrombocytopenia.  Given mild thrombocytopenia, no intervention would be warranted.  Patient was provided reassurance.   RTC in 4 months for follow-up with repeat labs.

## 2024-08-03 ENCOUNTER — Encounter: Payer: Self-pay | Admitting: Family Medicine

## 2024-08-04 ENCOUNTER — Ambulatory Visit

## 2024-08-12 ENCOUNTER — Ambulatory Visit (INDEPENDENT_AMBULATORY_CARE_PROVIDER_SITE_OTHER): Admitting: *Deleted

## 2024-08-12 DIAGNOSIS — Z789 Other specified health status: Secondary | ICD-10-CM

## 2024-08-12 NOTE — Progress Notes (Signed)
 Patient is in office today for a nurse visit for Immunization. Patient Injection was given in the  Left deltoid. Patient tolerated injection well.

## 2024-10-16 ENCOUNTER — Ambulatory Visit: Payer: Self-pay

## 2024-10-26 ENCOUNTER — Inpatient Hospital Stay: Payer: Self-pay | Admitting: Oncology

## 2024-10-26 ENCOUNTER — Inpatient Hospital Stay: Payer: Self-pay

## 2025-06-29 ENCOUNTER — Encounter: Admitting: Family Medicine
